# Patient Record
Sex: Male | Born: 1940 | Race: White | Hispanic: No | Marital: Married | Smoking: Never smoker
Health system: Southern US, Community
[De-identification: ages and names within clinical notes are randomized; demographics above are authoritative.]

## PROBLEM LIST (undated history)

## (undated) DIAGNOSIS — C61 Malignant neoplasm of prostate: Secondary | ICD-10-CM

## (undated) DIAGNOSIS — E785 Hyperlipidemia, unspecified: Secondary | ICD-10-CM

## (undated) DIAGNOSIS — C439 Malignant melanoma of skin, unspecified: Secondary | ICD-10-CM

## (undated) DIAGNOSIS — K219 Gastro-esophageal reflux disease without esophagitis: Secondary | ICD-10-CM

## (undated) DIAGNOSIS — F419 Anxiety disorder, unspecified: Secondary | ICD-10-CM

## (undated) DIAGNOSIS — I10 Essential (primary) hypertension: Secondary | ICD-10-CM

## (undated) HISTORY — DX: Anxiety disorder, unspecified: F41.9

## (undated) HISTORY — PX: HERNIA REPAIR: SHX51

## (undated) HISTORY — PX: MELANOMA EXCISION: SHX5266

## (undated) HISTORY — DX: Essential (primary) hypertension: I10

## (undated) HISTORY — DX: Hyperlipidemia, unspecified: E78.5

## (undated) HISTORY — DX: Gastro-esophageal reflux disease without esophagitis: K21.9

## (undated) HISTORY — DX: Malignant melanoma of skin, unspecified: C43.9

---

## 2008-05-24 ENCOUNTER — Ambulatory Visit: Payer: Self-pay | Admitting: Surgery

## 2008-05-27 ENCOUNTER — Other Ambulatory Visit: Payer: Self-pay

## 2008-05-27 ENCOUNTER — Ambulatory Visit: Payer: Self-pay | Admitting: Surgery

## 2008-11-29 ENCOUNTER — Ambulatory Visit: Payer: Self-pay | Admitting: Internal Medicine

## 2008-12-16 ENCOUNTER — Ambulatory Visit: Payer: Self-pay | Admitting: Internal Medicine

## 2008-12-24 ENCOUNTER — Ambulatory Visit: Payer: Self-pay | Admitting: Oncology

## 2008-12-27 ENCOUNTER — Ambulatory Visit: Payer: Self-pay | Admitting: Internal Medicine

## 2008-12-31 ENCOUNTER — Ambulatory Visit: Payer: Self-pay | Admitting: Oncology

## 2009-01-27 ENCOUNTER — Ambulatory Visit: Payer: Self-pay | Admitting: Otolaryngology

## 2009-01-27 ENCOUNTER — Ambulatory Visit: Payer: Self-pay | Admitting: Internal Medicine

## 2009-05-06 ENCOUNTER — Ambulatory Visit: Payer: Self-pay | Admitting: Otolaryngology

## 2009-05-09 ENCOUNTER — Ambulatory Visit: Payer: Self-pay | Admitting: Otolaryngology

## 2009-05-10 ENCOUNTER — Ambulatory Visit: Payer: Self-pay | Admitting: Otolaryngology

## 2009-05-12 ENCOUNTER — Inpatient Hospital Stay: Payer: Self-pay | Admitting: Otolaryngology

## 2009-05-27 ENCOUNTER — Ambulatory Visit: Payer: Self-pay | Admitting: Oncology

## 2009-05-29 ENCOUNTER — Ambulatory Visit: Payer: Self-pay | Admitting: Oncology

## 2009-06-03 ENCOUNTER — Ambulatory Visit: Payer: Self-pay | Admitting: Oncology

## 2009-06-18 ENCOUNTER — Emergency Department: Payer: Self-pay | Admitting: Unknown Physician Specialty

## 2009-06-29 ENCOUNTER — Ambulatory Visit: Payer: Self-pay | Admitting: Gastroenterology

## 2009-06-29 ENCOUNTER — Ambulatory Visit: Payer: Self-pay | Admitting: Oncology

## 2009-07-06 ENCOUNTER — Ambulatory Visit: Payer: Self-pay | Admitting: Unknown Physician Specialty

## 2009-07-29 ENCOUNTER — Ambulatory Visit: Payer: Self-pay | Admitting: Oncology

## 2009-08-05 ENCOUNTER — Ambulatory Visit: Payer: Self-pay | Admitting: Family Medicine

## 2009-08-29 ENCOUNTER — Ambulatory Visit: Payer: Self-pay | Admitting: Oncology

## 2009-09-27 ENCOUNTER — Ambulatory Visit: Payer: Self-pay | Admitting: Oncology

## 2009-09-28 ENCOUNTER — Ambulatory Visit: Payer: Self-pay | Admitting: Oncology

## 2009-10-20 ENCOUNTER — Ambulatory Visit: Payer: Self-pay | Admitting: Otolaryngology

## 2009-10-29 ENCOUNTER — Ambulatory Visit: Payer: Self-pay | Admitting: Oncology

## 2009-11-04 ENCOUNTER — Ambulatory Visit: Payer: Self-pay | Admitting: Oncology

## 2009-11-14 ENCOUNTER — Ambulatory Visit: Payer: Self-pay | Admitting: Radiation Oncology

## 2009-11-29 ENCOUNTER — Ambulatory Visit: Payer: Self-pay | Admitting: Oncology

## 2009-12-27 ENCOUNTER — Ambulatory Visit: Payer: Self-pay | Admitting: Oncology

## 2010-01-27 ENCOUNTER — Ambulatory Visit: Payer: Self-pay | Admitting: Oncology

## 2010-02-26 ENCOUNTER — Ambulatory Visit: Payer: Self-pay | Admitting: Oncology

## 2010-02-27 ENCOUNTER — Ambulatory Visit: Payer: Self-pay | Admitting: Oncology

## 2010-03-29 ENCOUNTER — Ambulatory Visit: Payer: Self-pay | Admitting: Oncology

## 2010-04-11 ENCOUNTER — Ambulatory Visit: Payer: Self-pay | Admitting: Oncology

## 2010-04-28 ENCOUNTER — Ambulatory Visit: Payer: Self-pay | Admitting: Oncology

## 2010-06-29 ENCOUNTER — Ambulatory Visit: Payer: Self-pay | Admitting: Oncology

## 2010-07-12 ENCOUNTER — Ambulatory Visit: Payer: Self-pay | Admitting: Oncology

## 2010-07-14 ENCOUNTER — Ambulatory Visit: Payer: Self-pay | Admitting: Oncology

## 2010-07-29 ENCOUNTER — Ambulatory Visit: Payer: Self-pay | Admitting: Oncology

## 2010-11-06 ENCOUNTER — Ambulatory Visit: Payer: Self-pay | Admitting: Oncology

## 2010-11-10 ENCOUNTER — Ambulatory Visit: Payer: Self-pay | Admitting: Oncology

## 2010-11-29 ENCOUNTER — Ambulatory Visit: Payer: Self-pay | Admitting: Oncology

## 2011-05-07 ENCOUNTER — Ambulatory Visit: Payer: Self-pay | Admitting: Oncology

## 2011-05-14 ENCOUNTER — Ambulatory Visit: Payer: Self-pay | Admitting: Oncology

## 2011-05-18 ENCOUNTER — Ambulatory Visit: Payer: Self-pay | Admitting: Oncology

## 2011-05-30 ENCOUNTER — Ambulatory Visit: Payer: Self-pay | Admitting: Oncology

## 2011-09-17 ENCOUNTER — Ambulatory Visit: Payer: Self-pay | Admitting: Oncology

## 2011-09-19 ENCOUNTER — Ambulatory Visit: Payer: Self-pay | Admitting: Oncology

## 2011-09-29 ENCOUNTER — Ambulatory Visit: Payer: Self-pay | Admitting: Oncology

## 2011-12-30 ENCOUNTER — Ambulatory Visit: Payer: Self-pay

## 2012-01-05 ENCOUNTER — Ambulatory Visit: Payer: Self-pay | Admitting: Internal Medicine

## 2012-01-15 ENCOUNTER — Ambulatory Visit: Payer: Self-pay | Admitting: Oncology

## 2012-01-23 ENCOUNTER — Ambulatory Visit: Payer: Self-pay | Admitting: Oncology

## 2012-01-23 LAB — CBC CANCER CENTER
Basophil %: 0.9 %
Eosinophil %: 2.7 %
HGB: 15 g/dL (ref 13.0–18.0)
Lymphocyte #: 1.6 x10 3/mm (ref 1.0–3.6)
Lymphocyte %: 33.3 %
MCH: 32.6 pg (ref 26.0–34.0)
Neutrophil %: 53.3 %
Platelet: 226 x10 3/mm (ref 150–440)
RBC: 4.61 10*6/uL (ref 4.40–5.90)
RDW: 13.4 % (ref 11.5–14.5)

## 2012-01-23 LAB — LACTATE DEHYDROGENASE: LDH: 201 U/L (ref 87–241)

## 2012-01-28 ENCOUNTER — Ambulatory Visit: Payer: Self-pay | Admitting: Oncology

## 2012-02-06 ENCOUNTER — Ambulatory Visit: Payer: Self-pay | Admitting: Oncology

## 2012-02-27 ENCOUNTER — Ambulatory Visit: Payer: Self-pay | Admitting: Oncology

## 2012-03-29 ENCOUNTER — Ambulatory Visit: Payer: Self-pay | Admitting: Oncology

## 2012-07-28 ENCOUNTER — Ambulatory Visit: Payer: Self-pay | Admitting: Oncology

## 2012-08-06 ENCOUNTER — Ambulatory Visit: Payer: Self-pay | Admitting: Oncology

## 2012-08-06 LAB — CBC CANCER CENTER
HCT: 42.8 % (ref 40.0–52.0)
Lymphocyte %: 26.1 %
MCHC: 35.2 g/dL (ref 32.0–36.0)
Monocyte %: 8.8 %
RBC: 4.51 10*6/uL (ref 4.40–5.90)
RDW: 14.3 % (ref 11.5–14.5)
WBC: 5.6 x10 3/mm (ref 3.8–10.6)

## 2012-08-06 LAB — BASIC METABOLIC PANEL
BUN: 18 mg/dL (ref 7–18)
Chloride: 105 mmol/L (ref 98–107)
Co2: 31 mmol/L (ref 21–32)
Creatinine: 1.05 mg/dL (ref 0.60–1.30)
EGFR (Non-African Amer.): >60
Glucose: 128 mg/dL — ABNORMAL HIGH (ref 65–99)
Osmolality: 289 (ref 275–301)
Potassium: 4.3 mmol/L (ref 3.5–5.1)
Sodium: 143 mmol/L (ref 136–145)

## 2012-08-06 LAB — LACTATE DEHYDROGENASE: LDH: 168 U/L (ref 85–241)

## 2012-08-07 LAB — PSA: PSA: 2.8 ng/mL (ref 0.0–4.0)

## 2012-08-29 ENCOUNTER — Ambulatory Visit: Payer: Self-pay | Admitting: Oncology

## 2013-01-26 ENCOUNTER — Emergency Department: Payer: Self-pay | Admitting: Emergency Medicine

## 2013-01-26 LAB — COMPREHENSIVE METABOLIC PANEL
Alkaline Phosphatase: 64 U/L (ref 50–136)
Anion Gap: 2 — ABNORMAL LOW (ref 7–16)
BUN: 22 mg/dL — ABNORMAL HIGH (ref 7–18)
Bilirubin,Total: 0.5 mg/dL (ref 0.2–1.0)
Calcium, Total: 8.7 mg/dL (ref 8.5–10.1)
Chloride: 106 mmol/L (ref 98–107)
Co2: 30 mmol/L (ref 21–32)
EGFR (Non-African Amer.): >60
Glucose: 117 mg/dL — ABNORMAL HIGH (ref 65–99)
Osmolality: 280 (ref 275–301)
SGOT(AST): 40 U/L — ABNORMAL HIGH (ref 15–37)
SGPT (ALT): 30 U/L (ref 12–78)
Sodium: 138 mmol/L (ref 136–145)

## 2013-01-26 LAB — APTT: Activated PTT: 26.8 secs (ref 23.6–35.9)

## 2013-01-26 LAB — CBC WITH DIFFERENTIAL/PLATELET
Basophil #: 0.1 10*3/uL (ref 0.0–0.1)
Basophil %: 1.2 %
Eosinophil #: 0.1 10*3/uL (ref 0.0–0.7)
HGB: 14.8 g/dL (ref 13.0–18.0)
Lymphocyte #: 2 10*3/uL (ref 1.0–3.6)
MCHC: 35.2 g/dL (ref 32.0–36.0)
MCV: 93 fL (ref 80–100)
Monocyte #: 0.5 x10 3/mm (ref 0.2–1.0)
Monocyte %: 7.9 %
Neutrophil #: 3.5 10*3/uL (ref 1.4–6.5)
Neutrophil %: 56.7 %
Platelet: 213 10*3/uL (ref 150–440)
RDW: 14.1 % (ref 11.5–14.5)

## 2013-01-26 LAB — TROPONIN I: Troponin-I: <0.02 ng/mL

## 2013-02-04 ENCOUNTER — Ambulatory Visit: Payer: Self-pay | Admitting: Oncology

## 2013-02-04 LAB — CBC CANCER CENTER
Basophil #: 0.1 x10 3/mm (ref 0.0–0.1)
Basophil %: 1.5 %
Eosinophil #: 0.1 x10 3/mm (ref 0.0–0.7)
Eosinophil %: 1.9 %
HCT: 43.1 % (ref 40.0–52.0)
HGB: 14.6 g/dL (ref 13.0–18.0)
Lymphocyte #: 2.1 x10 3/mm (ref 1.0–3.6)
Lymphocyte %: 37.4 %
MCH: 31.4 pg (ref 26.0–34.0)
MCHC: 34 g/dL (ref 32.0–36.0)
MCV: 92 fL (ref 80–100)
Monocyte #: 0.6 x10 3/mm (ref 0.2–1.0)
Monocyte %: 10.7 %
Neutrophil #: 2.7 x10 3/mm (ref 1.4–6.5)
Neutrophil %: 48.5 %
Platelet: 209 x10 3/mm (ref 150–440)
RBC: 4.66 10*6/uL (ref 4.40–5.90)
RDW: 13.8 % (ref 11.5–14.5)
WBC: 5.6 x10 3/mm (ref 3.8–10.6)

## 2013-02-04 LAB — LACTATE DEHYDROGENASE: LDH: 175 U/L (ref 85–241)

## 2013-02-26 ENCOUNTER — Ambulatory Visit: Payer: Self-pay | Admitting: Oncology

## 2013-08-06 ENCOUNTER — Ambulatory Visit: Payer: Self-pay | Admitting: Oncology

## 2013-08-12 ENCOUNTER — Ambulatory Visit: Payer: Self-pay | Admitting: Oncology

## 2013-08-12 LAB — CBC CANCER CENTER
Basophil #: 0.1 x10 3/mm (ref 0.0–0.1)
Basophil %: 1.3 %
Eosinophil #: 0.1 x10 3/mm (ref 0.0–0.7)
Eosinophil %: 1.7 %
HCT: 44.9 % (ref 40.0–52.0)
Lymphocyte #: 2.2 x10 3/mm (ref 1.0–3.6)
Lymphocyte %: 35.8 %
MCHC: 34.4 g/dL (ref 32.0–36.0)
Platelet: 180 x10 3/mm (ref 150–440)
RDW: 14 % (ref 11.5–14.5)

## 2013-08-12 LAB — LACTATE DEHYDROGENASE: LDH: 175 U/L (ref 85–241)

## 2013-08-29 ENCOUNTER — Ambulatory Visit: Payer: Self-pay | Admitting: Oncology

## 2013-12-22 LAB — CBC WITH DIFFERENTIAL/PLATELET
BASOS PCT: 0.4 %
Basophil #: 0.1 10*3/uL (ref 0.0–0.1)
Eosinophil #: 0 10*3/uL (ref 0.0–0.7)
Eosinophil %: 0.1 %
HCT: 49.9 % (ref 40.0–52.0)
HGB: 17.1 g/dL (ref 13.0–18.0)
LYMPHS ABS: 0.8 10*3/uL — AB (ref 1.0–3.6)
Lymphocyte %: 5.3 %
MCH: 32.3 pg (ref 26.0–34.0)
MCHC: 34.2 g/dL (ref 32.0–36.0)
MCV: 94 fL (ref 80–100)
MONO ABS: 0.6 x10 3/mm (ref 0.2–1.0)
Monocyte %: 3.9 %
NEUTROS ABS: 13.4 10*3/uL — AB (ref 1.4–6.5)
NEUTROS PCT: 90.3 %
PLATELETS: 213 10*3/uL (ref 150–440)
RBC: 5.29 10*6/uL (ref 4.40–5.90)
RDW: 14.1 % (ref 11.5–14.5)
WBC: 14.9 10*3/uL — ABNORMAL HIGH (ref 3.8–10.6)

## 2013-12-22 LAB — COMPREHENSIVE METABOLIC PANEL
ALBUMIN: 4.7 g/dL (ref 3.4–5.0)
ALK PHOS: 70 U/L
Anion Gap: 7 (ref 7–16)
BILIRUBIN TOTAL: 0.9 mg/dL (ref 0.2–1.0)
BUN: 31 mg/dL — AB (ref 7–18)
CALCIUM: 9.5 mg/dL (ref 8.5–10.1)
CO2: 23 mmol/L (ref 21–32)
Chloride: 104 mmol/L (ref 98–107)
Creatinine: 1.53 mg/dL — ABNORMAL HIGH (ref 0.60–1.30)
EGFR (African American): 51 — ABNORMAL LOW
EGFR (Non-African Amer.): 44 — ABNORMAL LOW
Glucose: 200 mg/dL — ABNORMAL HIGH (ref 65–99)
Osmolality: 280 (ref 275–301)
Potassium: 4.5 mmol/L (ref 3.5–5.1)
SGOT(AST): 38 U/L — ABNORMAL HIGH (ref 15–37)
SGPT (ALT): 45 U/L (ref 12–78)
SODIUM: 134 mmol/L — AB (ref 136–145)
Total Protein: 9 g/dL — ABNORMAL HIGH (ref 6.4–8.2)

## 2013-12-22 LAB — MAGNESIUM: MAGNESIUM: 1.8 mg/dL

## 2013-12-22 LAB — LIPASE, BLOOD: Lipase: 85 U/L (ref 73–393)

## 2013-12-22 LAB — TROPONIN I

## 2013-12-23 ENCOUNTER — Inpatient Hospital Stay: Payer: Self-pay | Admitting: Internal Medicine

## 2013-12-23 LAB — URINALYSIS, COMPLETE
BACTERIA: NONE SEEN
Bilirubin,UR: NEGATIVE
Blood: NEGATIVE
GLUCOSE, UR: NEGATIVE mg/dL (ref 0–75)
Hyaline Cast: 25
Leukocyte Esterase: NEGATIVE
NITRITE: NEGATIVE
PH: 5 (ref 4.5–8.0)
Protein: 30
RBC,UR: 1 /HPF (ref 0–5)
Specific Gravity: 1.027 (ref 1.003–1.030)
Squamous Epithelial: <1
WBC UR: 2 /HPF (ref 0–5)

## 2013-12-23 LAB — OCCULT BLOOD X 1 CARD TO LAB, STOOL: Occult Blood, Feces: NEGATIVE

## 2013-12-23 LAB — TROPONIN I
Troponin-I: <0.02 ng/mL
Troponin-I: <0.02 ng/mL

## 2013-12-23 LAB — CK TOTAL AND CKMB (NOT AT ARMC)
CK, TOTAL: 156 U/L
CK, Total: 80 U/L
CK, Total: 109 U/L
CK-MB: 2.3 ng/mL (ref 0.5–3.6)
CK-MB: 2.8 ng/mL (ref 0.5–3.6)
CK-MB: 2.7 ng/mL (ref 0.5–3.6)

## 2013-12-23 LAB — CLOSTRIDIUM DIFFICILE(ARMC)

## 2013-12-24 LAB — CBC WITH DIFFERENTIAL/PLATELET
Basophil #: 0 10*3/uL (ref 0.0–0.1)
Basophil %: 1 %
Eosinophil #: 0.1 10*3/uL (ref 0.0–0.7)
Eosinophil %: 2.4 %
HCT: 35.9 % — ABNORMAL LOW (ref 40.0–52.0)
HGB: 12.7 g/dL — ABNORMAL LOW (ref 13.0–18.0)
LYMPHS PCT: 21.1 %
Lymphocyte #: 0.8 10*3/uL — ABNORMAL LOW (ref 1.0–3.6)
MCH: 33.3 pg (ref 26.0–34.0)
MCHC: 35.3 g/dL (ref 32.0–36.0)
MCV: 94 fL (ref 80–100)
MONO ABS: 0.6 x10 3/mm (ref 0.2–1.0)
Monocyte %: 16 %
NEUTROS ABS: 2.2 10*3/uL (ref 1.4–6.5)
NEUTROS PCT: 59.5 %
Platelet: 129 10*3/uL — ABNORMAL LOW (ref 150–440)
RBC: 3.81 10*6/uL — AB (ref 4.40–5.90)
RDW: 14 % (ref 11.5–14.5)
WBC: 3.7 10*3/uL — ABNORMAL LOW (ref 3.8–10.6)

## 2013-12-24 LAB — BASIC METABOLIC PANEL
ANION GAP: 6 — AB (ref 7–16)
BUN: 18 mg/dL (ref 7–18)
CHLORIDE: 110 mmol/L — AB (ref 98–107)
CO2: 24 mmol/L (ref 21–32)
Calcium, Total: 7.7 mg/dL — ABNORMAL LOW (ref 8.5–10.1)
Creatinine: 1.24 mg/dL (ref 0.60–1.30)
EGFR (African American): >60
EGFR (Non-African Amer.): 57 — ABNORMAL LOW
Glucose: 98 mg/dL (ref 65–99)
Osmolality: 281 (ref 275–301)
POTASSIUM: 3.9 mmol/L (ref 3.5–5.1)
SODIUM: 140 mmol/L (ref 136–145)

## 2013-12-24 LAB — HEMOGLOBIN A1C: HEMOGLOBIN A1C: 6 % (ref 4.2–6.3)

## 2013-12-25 LAB — BASIC METABOLIC PANEL
Anion Gap: 6 — ABNORMAL LOW (ref 7–16)
BUN: 13 mg/dL (ref 7–18)
CALCIUM: 7.5 mg/dL — AB (ref 8.5–10.1)
CHLORIDE: 114 mmol/L — AB (ref 98–107)
CO2: 22 mmol/L (ref 21–32)
Creatinine: 1.17 mg/dL (ref 0.60–1.30)
Glucose: 90 mg/dL (ref 65–99)
OSMOLALITY: 283 (ref 275–301)
POTASSIUM: 3.8 mmol/L (ref 3.5–5.1)
Sodium: 142 mmol/L (ref 136–145)

## 2013-12-25 LAB — WBCS, STOOL

## 2013-12-26 LAB — STOOL CULTURE

## 2013-12-28 LAB — CULTURE, BLOOD (SINGLE)

## 2014-02-03 ENCOUNTER — Ambulatory Visit: Payer: Self-pay | Admitting: Oncology

## 2014-02-03 LAB — CBC CANCER CENTER
Basophil #: 0.1 x10 3/mm (ref 0.0–0.1)
Basophil %: 1.1 %
Eosinophil #: 0.1 x10 3/mm (ref 0.0–0.7)
Eosinophil %: 2.5 %
HCT: 40.8 % (ref 40.0–52.0)
HGB: 14 g/dL (ref 13.0–18.0)
LYMPHS ABS: 1.7 x10 3/mm (ref 1.0–3.6)
Lymphocyte %: 31.4 %
MCH: 32 pg (ref 26.0–34.0)
MCHC: 34.5 g/dL (ref 32.0–36.0)
MCV: 93 fL (ref 80–100)
MONOS PCT: 10.4 %
Monocyte #: 0.6 x10 3/mm (ref 0.2–1.0)
NEUTROS ABS: 3 x10 3/mm (ref 1.4–6.5)
Neutrophil %: 54.6 %
Platelet: 190 x10 3/mm (ref 150–440)
RBC: 4.39 10*6/uL — ABNORMAL LOW (ref 4.40–5.90)
RDW: 14.5 % (ref 11.5–14.5)
WBC: 5.6 x10 3/mm (ref 3.8–10.6)

## 2014-02-03 LAB — LACTATE DEHYDROGENASE: LDH: 184 U/L (ref 85–241)

## 2014-02-26 ENCOUNTER — Ambulatory Visit: Payer: Self-pay | Admitting: Oncology

## 2014-07-09 DIAGNOSIS — C439 Malignant melanoma of skin, unspecified: Secondary | ICD-10-CM

## 2014-07-09 DIAGNOSIS — F419 Anxiety disorder, unspecified: Secondary | ICD-10-CM | POA: Insufficient documentation

## 2014-07-09 DIAGNOSIS — E7800 Pure hypercholesterolemia, unspecified: Secondary | ICD-10-CM | POA: Insufficient documentation

## 2014-07-09 DIAGNOSIS — K219 Gastro-esophageal reflux disease without esophagitis: Secondary | ICD-10-CM

## 2014-07-09 DIAGNOSIS — I10 Essential (primary) hypertension: Secondary | ICD-10-CM

## 2014-07-09 DIAGNOSIS — E785 Hyperlipidemia, unspecified: Secondary | ICD-10-CM | POA: Insufficient documentation

## 2014-07-09 HISTORY — DX: Malignant melanoma of skin, unspecified: C43.9

## 2014-07-09 HISTORY — DX: Anxiety disorder, unspecified: F41.9

## 2014-07-09 HISTORY — DX: Gastro-esophageal reflux disease without esophagitis: K21.9

## 2014-07-09 HISTORY — DX: Essential (primary) hypertension: I10

## 2014-07-09 HISTORY — DX: Hyperlipidemia, unspecified: E78.5

## 2014-12-31 ENCOUNTER — Ambulatory Visit
Admit: 2014-12-31 | Disposition: A | Discharge status: Home / Self Care | Payer: Self-pay | Attending: Oncology | Admitting: Oncology

## 2015-01-28 ENCOUNTER — Ambulatory Visit
Admit: 2015-01-28 | Disposition: A | Discharge status: Home / Self Care | Payer: Self-pay | Attending: Oncology | Admitting: Oncology

## 2015-02-19 NOTE — Discharge Summary (Signed)
PATIENT NAMEAGUSTIN, Christian MR#:  [id number] DATE OF BIRTH:  1941/05/17  DATE OF ADMISSION:  12/23/2013 DATE OF DISCHARGE:  12/25/2013  PRIMARY CARE PHYSICIAN:  Dr. Ilene Qua  FINAL DIAGNOSES: 1.  Acute gastroenteritis.  2.  Hypotension.  3.  Acute renal failure, which improved.  4.  Hyperlipidemia.  5.  Gastroesophageal reflux disease.  6.  Impaired fasting glucose.   MEDICATIONS ON DISCHARGE: Include lisinopril 20 mg daily, omeprazole 20 mg daily, simvastatin 20 mg at bedtime, metronidazole 250 mg every 8 hours for 5 more days, Zofran 4 mg every 4 hours as needed for nausea, Cipro 500 mg 1 tablet every 12 hours for 5 more days.   DIET: Low sodium diet, regular consistency.   ACTIVITY: As tolerated.  FOLLOWUP: With Dr. Ilene Qua as outpatient, 1 to 2 weeks.   HOSPITAL COURSE: The patient was admitted 12/23/2013, discharged 12/25/2013, came in with nausea, vomiting, diarrhea. Was admitted with acute gastroenteritis, family members had similar symptoms. The patient was given IV fluid hydration and started on empiric antibiotics. The patient had low blood pressure and blood pressure medications were held and acute renal failure.  LABORATORY AND RADIOLOGICAL DATA DURING THE HOSPITAL COURSE: Included a troponin that was negative, magnesium of 1.8, lipase 85, glucose 200, BUN 31, creatinine 1.53. Sodium 134, potassium 4.5, chloride 104, CO2 of 23, calcium 9.5. Liver function tests: AST 38, ALT 45, alkaline phosphatase 70, total protein 9.0. White blood cell count 14.9, H and H 17.1 and 49.9, platelet count of 213, lactic acid 3.1.  EKG normal sinus rhythm, left atrial enlargement, left ventricular hypertrophy. Urinalysis: Trace ketones.  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  Blood cultures negative. Clostridium  difficile antigen negative. Troponins negative x 2. Occult blood negative. White blood cells stool 80% polymorphonuclear cells, 10% eosinophils, 10% mononuclear cells. No red blood cells seen. Stool culture no Salmonella or Shigella. Ova and parasites are still pending. Hemoglobin A1c 6.0. Creatinine upon discharge 1.17. Last white blood cell count 3.7.  HOSPITAL COURSE PER PROBLEM LIST:  1.  For the gastroenteritis, since family members have this also, could be a viral issue but could also be a bacterial issue. So far the stool studies are negative. Ova and parasites still pending. The patient empirically started on Cipro and Flagyl and will complete a week's course. The patient had improved. Still had some diarrhea but was able to tolerate diet. No abdominal pain and was tolerating the food.  2.  Hypotension. This had improved. Blood pressure medications were held during the hospital course. Lisinopril can be restarted as outpatient.  3.  Acute renal failure. This had improved during the hospital course.  4.  Hyperlipidemia: The patient is on his Zocor.  5.  Gastroesophageal reflux disease. The patient is on omeprazole as outpatient.  6.  Impaired fasting glucose. Hemoglobin A1c 6.0.  Diet:  Exercise and weight loss is important.  7.  Abdominal aortic aneurysm.  Follow up as outpatient with Dr. Ilene Qua.  TIME SPENT ON DISCHARGE:  35 minutes.   ____________________________ Tana Conch. Leslye Peer, MD rjw:ce D: 12/25/2013 17:34:58 ET T: 12/25/2013 18:00:33 ET JOB#: [id number]  cc: Tana Conch. Leslye Peer, MD, <Dictator> Fonnie Jarvis. Ilene Qua, MD Marisue Brooklyn MD ELECTRONICALLY SIGNED 12/29/2013 10:06

## 2015-02-19 NOTE — H&P (Signed)
PATIENT NAMEKASIR, Raymond Christian MR#:  [id number] DATE OF BIRTH:  11/08/1940  DATE OF ADMISSION:  12/23/2013  PRIMARY CARE PHYSICIAN:  Dr. Domenick Gong.   REFERRING PHYSICIAN:  Dr. Marjean Donna.   CHIEF COMPLAINT:  Nausea, vomiting and diarrhea.   HISTORY OF PRESENT ILLNESS:  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  The patient's wife had similar symptoms, lasted for two days.  The patient states has watery diarrhea.  Vomiting clear liquid.  Work-up in the Emergency Department with a CT abdomen and pelvis showed fluid within the colon likely representing the diarrhea state.  There was a 7 mm gallstone in the region of the gallbladder neck.  Otherwise, no CT evidence of acute cholecystitis.  The patient denies having any fever.  Denies having any abdominal pain.  The patient states that experienced severe generalized weakness.  The patient is found to have elevated white blood cell count of 15,000, elevated lactic acid of 3.1.  The patient received IV fluids.   PAST MEDICAL HISTORY: 1.  Hypertension.  2.  Metastatic melanoma of the neck.  3.  Melanoma of the head. 4.  Bilateral hernia surgery.  ALLERGIES:  No known drug allergies.   HOME MEDICATIONS: 1.  Simvastatin 20 mg once a day.  2.  Omeprazole 20 mg once a day.  3.  Lisinopril 20 mg once a day.   SOCIAL HISTORY:  No history of smoking.  Drank alcohol heavily in the past, quit 20 years back.  Denies using any illicit drugs.  Married, lives with his wife, retired.   FAMILY HISTORY:  Mother, 37 year old, still living, has Alzheimer's dementia.  Father died of heart problems.   REVIEW OF SYSTEMS: CONSTITUTIONAL:  Severe generalized weakness.  EYES:  No change in vision.  EARS, NOSE,  THROAT:  No change in hearing.  RESPIRATORY:  No cough, shortness of breath.  CARDIOVASCULAR:  No chest pains or palpitations.  GASTROINTESTINAL:  Has nausea, vomiting and diarrhea.  GENITOURINARY:  No dysuria or hematuria.  ENDOCRINE:  No polyuria or polydipsia. HEMATOLOGIC:  No easy bruising or bleeding.  SKIN:  No rash or lesions.  MUSCULOSKELETAL:  No joint pains and aches.  NEUROLOGIC:  No weakness or numbness in any part of the body.   PHYSICAL EXAMINATION: GENERAL:  This is a well-built, well-nourished, age-appropriate male, lying down in the bed, not in distress.  VITAL SIGNS:  Temperature 96.9, pulse 96, blood pressure 98/64, respiratory rate of 16, oxygen saturation is 95% on room air.  HEENT:  Head normocephalic, atraumatic.  Eyes, no scleral icterus.  Conjunctivae normal.  Pupils equal and react to light.  Mucous membranes, mild dryness.  No pharyngeal erythema.  NECK:  Supple.  No lymphadenopathy.  No JVD.  No carotid bruit.  No thyromegaly.  CHEST:  Has no focal tenderness.  LUNGS:  Bilateral clear to auscultation.  HEART:  S1, S2 regular.  No murmurs are heard.  ABDOMEN:  Bowel sounds plus, soft, nontender, nondistended.  Could not appreciate any hepatosplenomegaly.  EXTREMITIES:  No pedal edema.  Pulses 2+.  NEUROLOGIC:  The patient is alert, oriented to place, person and time.  Cranial nerves II through XII intact.  Motor 5 by 5 in upper and lower extremities.  SKIN:  No rash or lesions.  MUSCULOSKELETAL:  Good range of motion in all the extremities.   LABORATORY DATA:  UA negative for nitrites and leukocyte esterase.  CT abdomen and pelvis, as mentioned above, fluid within the colon consistent with diarrhea state, a 7 mm gallstone in the region of the gallbladder neck.  No CT evidence of acute cholecystitis.   distal abdominal aortic right common iliac artery aneurysm, cardiomegaly with a large hiatal hernia.   Lactic acid of 3.1.  CMP:  BUN 31, creatinine of 1.53.   Glucose of 200.  Rest of all the values are within normal limits.  CBC:  WBC of 15,000 with a left shift of 90% neutrophils.   ASSESSMENT AND PLAN:  Mr. Raymond Christian is a 74 year old male who comes to the Emergency Department with nausea, vomiting and diarrhea.  1.  Acute gastroenteritis.  The patient's wife had similar symptoms.  Continue with IV fluids.  Continue with supportive management.  We will also check the cardiac enzymes.  The patient denies having any chest pain, palpitations.  Clostridium difficile is negative. 2.  Mild renal insufficiency secondary to dehydration.  Continue with IV fluids.  3.  Hypertension.  We will hold the blood pressure medications, concerning about low-normal blood pressure.  4.  Keep the patient on deep vein thrombosis prophylaxis with Lovenox.  TIME SPENT:  50 minutes.    ____________________________ Monica Becton, MD pv:ea D: 12/23/2013 02:07:48 ET T: 12/23/2013 02:57:47 ET JOB#: [id number]  cc: Monica Becton, MD, <Dictator> Fonnie Jarvis. Ilene Qua, MD Monica Becton MD ELECTRONICALLY SIGNED 01/03/2014 22:39

## 2015-06-30 DIAGNOSIS — C61 Malignant neoplasm of prostate: Secondary | ICD-10-CM

## 2015-06-30 HISTORY — DX: Malignant neoplasm of prostate: C61

## 2015-07-29 ENCOUNTER — Encounter: Payer: Self-pay | Admitting: *Deleted

## 2015-08-01 ENCOUNTER — Ambulatory Visit (INDEPENDENT_AMBULATORY_CARE_PROVIDER_SITE_OTHER): Payer: Commercial Managed Care - PPO | Admitting: Obstetrics and Gynecology

## 2015-08-01 ENCOUNTER — Encounter: Payer: Self-pay | Admitting: Obstetrics and Gynecology

## 2015-08-01 VITALS — BP 137/83 | HR 79 | Resp 16 | Ht 68.0 in | Wt 176.8 lb

## 2015-08-01 DIAGNOSIS — R972 Elevated prostate specific antigen [PSA]: Secondary | ICD-10-CM | POA: Diagnosis not present

## 2015-08-01 DIAGNOSIS — R3989 Other symptoms and signs involving the genitourinary system: Secondary | ICD-10-CM | POA: Diagnosis not present

## 2015-08-01 LAB — URINALYSIS, COMPLETE
Bilirubin, UA: NEGATIVE
Glucose, UA: NEGATIVE
Ketones, UA: NEGATIVE
Leukocytes, UA: NEGATIVE
Nitrite, UA: NEGATIVE
PH UA: 5 (ref 5.0–7.5)
Protein, UA: NEGATIVE
RBC, UA: NEGATIVE
Specific Gravity, UA: 1.02 (ref 1.005–1.030)
Urobilinogen, Ur: 0.2 mg/dL (ref 0.2–1.0)

## 2015-08-01 LAB — BLADDER SCAN AMB NON-IMAGING

## 2015-08-01 LAB — MICROSCOPIC EXAMINATION
Bacteria, UA: NONE SEEN
Epithelial Cells (non renal): NONE SEEN /hpf (ref 0–10)
RBC MICROSCOPIC, UA: NONE SEEN /HPF (ref 0–?)
WBC UA: NONE SEEN /HPF (ref 0–?)

## 2015-08-01 NOTE — Progress Notes (Signed)
08/01/2015 12:58 PM   Raymond Christian June 18, 1941 [id number]  Referring provider: No referring provider defined for this encounter.  Chief Complaint  Patient presents with  . Elevated PSA  . Establish Care    HPI: Patient is a healthy 74 year old male presenting today as a referral from his primary care provider for elevated PSA of 5.14 on 07/22/15. Last available PSA 4.4 in 2013. He currently denies any significant urinary symptoms including gross hematuria, dysuria, urgency, frequency or sensation of incomplete bladder emptying.  No family history of prostate cancer.  07/22/15 5.14 2013 PSA 4.4 2012 PSA 2.8  PMH: Past Medical History  Diagnosis Date  . Essential (primary) hypertension 07/09/2014  . Gastro-esophageal reflux disease without esophagitis 07/09/2014  . Anxiety 07/09/2014  . HLD (hyperlipidemia) 07/09/2014  . Malignant melanoma ([place]) 07/09/2014    Surgical History: Past Surgical History  Procedure Laterality Date  . Hernia repair      x2  . Melanoma excision      Home Medications:    Medication List       This list is accurate as of: 08/01/15 12:58 PM.  Always use your most recent med list.               lisinopril-hydrochlorothiazide 20-12.5 MG tablet  Commonly known as:  PRINZIDE,ZESTORETIC  Take by mouth.     omeprazole 20 MG capsule  Commonly known as:  PRILOSEC  Take by mouth.     simvastatin 20 MG tablet  Commonly known as:  ZOCOR  Take by mouth.        Allergies: No Known Allergies  Family History: Family History  Problem Relation Age of Onset  . Diabetes Sister     Social History:  reports that he has never smoked. He does not have any smokeless tobacco history on file. He reports that he drinks alcohol. He reports that he does not use illicit drugs.  ROS: UROLOGY Frequent Urination?: No Hard to postpone urination?: No Burning/pain with urination?: No Get up at night to urinate?: Yes Leakage of urine?: No Urine stream  starts and stops?: No Trouble starting stream?: No Do you have to strain to urinate?: No Blood in urine?: No Urinary tract infection?: No Sexually transmitted disease?: No Injury to kidneys or bladder?: No Painful intercourse?: No Weak stream?: No Erection problems?: Yes Penile pain?: No  Gastrointestinal Nausea?: No Vomiting?: No Indigestion/heartburn?: Yes Diarrhea?: No Constipation?: No  Constitutional Fever: No Night sweats?: No Weight loss?: No Fatigue?: No  Skin Skin rash/lesions?: No Itching?: No  Eyes Blurred vision?: No Double vision?: No  Ears/Nose/Throat Sore throat?: Yes Sinus problems?: No  Hematologic/Lymphatic Swollen glands?: No Easy bruising?: Yes  Cardiovascular Leg swelling?: No Chest pain?: No  Respiratory Cough?: No Shortness of breath?: No  Endocrine Excessive thirst?: No  Musculoskeletal Back pain?: No Joint pain?: No  Neurological Headaches?: No Dizziness?: No  Psychologic Depression?: No Anxiety?: No  Physical Exam: BP 137/83 mmHg  Pulse 79  Resp 16  Ht 5\' 8"  (1.727 m)  Wt 176 lb 12.8 oz (80.196 kg)  BMI 26.89 kg/m2  Constitutional:  Alert and oriented, No acute distress. HEENT: [place] AT, moist mucus membranes.  Trachea midline, no masses. Cardiovascular: No clubbing, cyanosis, or edema. Respiratory: Normal respiratory effort, no increased work of breathing. GI: Abdomen is soft, nontender, nondistended, no abdominal masses GU: No CVA tenderness.  DRE: small firm prostate, no nodules, uncircumcised phallus easily retractable foreskin, testicles descended bilaterally without palpable masses or tenderness Skin: No  rashes, bruises or suspicious lesions. Lymph: No cervical or inguinal adenopathy. Neurologic: Grossly intact, no focal deficits, moving all 4 extremities. Psychiatric: Normal mood and affect.  Laboratory Data: Results for orders placed or performed in visit on 08/01/15  BLADDER SCAN AMB NON-IMAGING   Result Value Ref Range   Scan Result 50ml    Urinalysis  Pertinent Imaging:   Assessment & Plan:    1. Elevated PSA- DRE suspicious for a small firm prostate on exam. Will recheck PSA today.  Most likely will recommend prostate biopsy given patient's good health and suspicious DRE.  We reviewed the implications of an elevated PSA and the uncertainty surrounding it. In general, a man's PSA increases with age and is produced by both normal and cancerous prostate tissue. Differential for elevated PSA is BPH, prostate cancer, infection, recent intercourse/ejaculation, prostate infarction, recent urethroscopic manipulation (foley placement/cystoscopy) and prostatitis. Management of an elevated PSA can include observation or prostate biopsy and we discussed this in detail. We discussed that indications for prostate biopsy are defined by age and race specific PSA cutoffs as well as a PSA velocity of 0.75/year. - Urinalysis, Complete - BLADDER SCAN AMB NON-IMAGING   Return in about 3 months (around 11/01/2015).  These notes generated with voice recognition software. I apologize for typographical errors.  Herbert Moors, [facility] [address] [place], [place] [address] [phone]

## 2015-08-02 ENCOUNTER — Telehealth: Payer: Self-pay | Admitting: Obstetrics and Gynecology

## 2015-08-02 LAB — PSA, TOTAL AND FREE
PSA FREE PCT: 12.1 %
PSA FREE: 0.52 ng/mL
Prostate Specific Ag, Serum: 4.3 ng/mL — ABNORMAL HIGH (ref 0.0–4.0)

## 2015-08-02 NOTE — Telephone Encounter (Signed)
-----   Message from Roda Shutters, [place] sent at 08/02/2015 10:57 AM EDT ----- Please notify patient that his PSA was 4.3 which is slightly lower than his previous however given his abnormal prostate exam at his last visit I recommend that he have a prostate biopsy. Please either mail patient the pre-and post biopsy instructions or have him come by the office and pick them up prior to biopsy. Please schedule appointment for prostate biopsy if patient is agreeable. Patient is not agreeable with prostate biopsy I recommend that he at least is to have a 4K score drawn. Thanks

## 2015-08-02 NOTE — Telephone Encounter (Signed)
Patient notified and is agreeable to a prostate biopsy. The biopsy instructions were given over the phone and he verbalized understanding a copy was also mailed to him, he was transferred to the front to have biopsy apt made/SW

## 2015-08-17 ENCOUNTER — Encounter: Payer: Self-pay | Admitting: Urology

## 2015-08-17 ENCOUNTER — Ambulatory Visit (INDEPENDENT_AMBULATORY_CARE_PROVIDER_SITE_OTHER): Payer: Commercial Managed Care - PPO | Admitting: Urology

## 2015-08-17 ENCOUNTER — Other Ambulatory Visit: Payer: Self-pay | Admitting: Urology

## 2015-08-17 VITALS — BP 158/76 | HR 80 | Ht 68.0 in | Wt 177.6 lb

## 2015-08-17 DIAGNOSIS — R972 Elevated prostate specific antigen [PSA]: Secondary | ICD-10-CM

## 2015-08-17 MED ORDER — GENTAMICIN SULFATE 40 MG/ML IJ SOLN
80.0000 mg | Freq: Once | INTRAMUSCULAR | Status: AC
  Administered 2015-08-17: 80 mg via INTRAMUSCULAR

## 2015-08-17 MED ORDER — LEVOFLOXACIN 500 MG PO TABS
500.0000 mg | ORAL_TABLET | Freq: Once | ORAL | Status: AC
  Administered 2015-08-17: 500 mg via ORAL

## 2015-08-17 NOTE — Progress Notes (Signed)
Prostate Biopsy Procedure   Informed consent was obtained after discussing risks/benefits of the procedure.  A time out was performed to ensure correct patient identity.  Pre-Procedure: - Last PSA Level: 4.4  - Gentamicin given prophylactically - Levaquin 500 mg administered PO -Transrectal Ultrasound performed revealing a 29  gm prostate -No significant hypoechoic or median lobe noted  Procedure: - Prostate block performed using 10 cc 1% lidocaine and biopsies taken from sextant areas, a total of 12 under ultrasound guidance.  Post-Procedure: - Patient tolerated the procedure well - He was counseled to seek immediate medical attention if experiences any severe pain, significant bleeding, or fevers - Return in one week to discuss biopsy results

## 2015-08-23 LAB — PATHOLOGY REPORT

## 2015-08-25 ENCOUNTER — Other Ambulatory Visit: Payer: Self-pay | Admitting: Urology

## 2015-08-25 ENCOUNTER — Ambulatory Visit (INDEPENDENT_AMBULATORY_CARE_PROVIDER_SITE_OTHER): Payer: Commercial Managed Care - PPO | Admitting: Urology

## 2015-08-25 ENCOUNTER — Telehealth: Payer: Self-pay

## 2015-08-25 VITALS — BP 152/92 | HR 71 | Ht 68.0 in | Wt 177.0 lb

## 2015-08-25 DIAGNOSIS — C61 Malignant neoplasm of prostate: Secondary | ICD-10-CM | POA: Diagnosis not present

## 2015-08-25 NOTE — Telephone Encounter (Signed)
  Oncology Nurse Navigator Documentation  Referral date to RadOnc/MedOnc: 08/25/15 (08/25/15 1400) Navigator Encounter Type: Introductory phone call (08/25/15 1400)         Interventions: Coordination of Care (08/25/15 1400)   Coordination of Care: MD Appointments (08/25/15 1400)        Time Spent with Patient: 15 (08/25/15 1400)   Spoke with Raymond Christian on the phone. Appt made for consult with Dr Baruch Gouty on 08/31/15 1330. Readback performed. States no questions or needs at present.

## 2015-08-25 NOTE — Progress Notes (Signed)
08/25/2015 11:54 AM   [name] Mar 21, 1941 [id number]  Referring provider: Sherrin Daisy, MD [place], [place] [address]  Chief Complaint  Patient presents with  . Prostate Cancer    biopsy results  . Results    HPI: The patient is a 74 year old gentleman who presents for his prostate biopsy results. His pre-biopsy PSA was 4.3. She shows one core 3+4 = 7 in 5% the core. He has 5 further cores of Gleason 3+3 = 6. The percentages are 2%, 5%, 5%, 10%, 25% and 54%. Her parents to further discuss these results.   PMH: Past Medical History  Diagnosis Date  . Essential (primary) hypertension 07/09/2014  . Gastro-esophageal reflux disease without esophagitis 07/09/2014  . Anxiety 07/09/2014  . HLD (hyperlipidemia) 07/09/2014  . Malignant melanoma ([place]) 07/09/2014    Surgical History: Past Surgical History  Procedure Laterality Date  . Hernia repair      x2  . Melanoma excision      Home Medications:    Medication List       This list is accurate as of: 08/25/15 11:54 AM.  Always use your most recent med list.               lisinopril-hydrochlorothiazide 20-12.5 MG tablet  Commonly known as:  PRINZIDE,ZESTORETIC  Take by mouth.     omeprazole 20 MG capsule  Commonly known as:  PRILOSEC  Take by mouth.     simvastatin 20 MG tablet  Commonly known as:  ZOCOR  Take by mouth.        Allergies: No Known Allergies  Family History: Family History  Problem Relation Age of Onset  . Diabetes Sister     Social History:  reports that he has never smoked. He does not have any smokeless tobacco history on file. He reports that he drinks alcohol. He reports that he does not use illicit drugs.  ROS:                                        Physical Exam: BP 152/92 mmHg  Pulse 71  Ht 5\' 8"  (1.727 m)  Wt 177 lb (80.287 kg)  BMI 26.92 kg/m2  Constitutional:  Alert and oriented, No acute distress. HEENT: [place] AT, moist  mucus membranes.  Trachea midline, no masses. Cardiovascular: No clubbing, cyanosis, or edema. Respiratory: Normal respiratory effort, no increased work of breathing. GI: Abdomen is soft, nontender, nondistended, no abdominal masses GU: No CVA tenderness.  Skin: No rashes, bruises or suspicious lesions. Lymph: No cervical or inguinal adenopathy. Neurologic: Grossly intact, no focal deficits, moving all 4 extremities. Psychiatric: Normal mood and affect.  Laboratory Data: Lab Results  Component Value Date   WBC 5.6 02/03/2014   HGB 14.0 02/03/2014   HCT 40.8 02/03/2014   MCV 93 02/03/2014   PLT 190 02/03/2014    Lab Results  Component Value Date   CREATININE 1.17 12/25/2013    Lab Results  Component Value Date   PSA 4.3* 08/01/2015   PSA 2.8 08/06/2012   PSA 4.4* 01/23/2012    No results found for: TESTOSTERONE  Lab Results  Component Value Date   HGBA1C 6.0 12/24/2013    Urinalysis    Component Value Date/Time   COLORURINE Amber 12/22/2013 2307   APPEARANCEUR Hazy 12/22/2013 2307   LABSPEC 1.027 12/22/2013 2307   PHURINE 5.0 12/22/2013 2307  GLUCOSEU Negative 08/01/2015 0959   GLUCOSEU Negative 12/22/2013 2307   HGBUR Negative 12/22/2013 2307   BILIRUBINUR Negative 08/01/2015 0959   BILIRUBINUR Negative 12/22/2013 2307   KETONESUR Trace 12/22/2013 2307   PROTEINUR 30 mg/dL 12/22/2013 2307   NITRITE Negative 08/01/2015 0959   NITRITE Negative 12/22/2013 2307   LEUKOCYTESUR Negative 08/01/2015 0959   LEUKOCYTESUR Negative 12/22/2013 2307    Assessment & Plan:    I had a long discussion with the patient regarding the natural history of prostate cancer. I discussed with him that he is of 74 years of age which which is past the age that many would recommend treatment of his prostate cancer, but this is controversial. I discussed his treatment options which include watchful waiting, active surveillance, radical prostatectomy, and radiation. He is against active  surveillance as he does not want repeat prostate biopsies. He is also against surgical treatment via radical prostatectomy. I agree that these are both reasonable decisions by him. He is in between watchful waiting versus radiation therapy. I discussed with him again that there is no standard answer for what his best course of action would be. He knows that he is of intermediate risk disease due to his one core 5% 3+4 = 7 prostate cancer. As such, I have set him up for referral to radiation oncology to discuss radiation treatment options. He is agreeable to this at this time however he is leaning towards watchful waiting. If he decides not to undergo radiation, we will see him back in the office in 6 months to repeat his PSA to ensure there are no rapid changes.   1.  Prostate cancer [facility]) - as above - Ambulatory referral to Radiation Oncology - PSA in 6 months   Return in about 6 months (around 02/23/2016) for with repeat PSA prior.  Nickie Retort, MD  [facility] [address], [place], [place] [address] [phone]

## 2015-08-31 ENCOUNTER — Ambulatory Visit
Admission: RE | Admit: 2015-08-31 | Discharge: 2015-08-31 | Disposition: A | Discharge status: Home / Self Care | Payer: Commercial Managed Care - PPO | Source: Ambulatory Visit | Attending: Radiation Oncology | Admitting: Radiation Oncology

## 2015-08-31 ENCOUNTER — Encounter: Payer: Self-pay | Admitting: Radiation Oncology

## 2015-08-31 VITALS — BP 169/89 | HR 72 | Temp 96.5°F | Resp 20 | Wt 177.9 lb

## 2015-08-31 DIAGNOSIS — K219 Gastro-esophageal reflux disease without esophagitis: Secondary | ICD-10-CM | POA: Insufficient documentation

## 2015-08-31 DIAGNOSIS — Z85820 Personal history of malignant melanoma of skin: Secondary | ICD-10-CM | POA: Insufficient documentation

## 2015-08-31 DIAGNOSIS — F419 Anxiety disorder, unspecified: Secondary | ICD-10-CM | POA: Insufficient documentation

## 2015-08-31 DIAGNOSIS — E785 Hyperlipidemia, unspecified: Secondary | ICD-10-CM | POA: Insufficient documentation

## 2015-08-31 DIAGNOSIS — Z510 Encounter for antineoplastic radiation therapy: Secondary | ICD-10-CM | POA: Insufficient documentation

## 2015-08-31 DIAGNOSIS — Z79899 Other long term (current) drug therapy: Secondary | ICD-10-CM | POA: Insufficient documentation

## 2015-08-31 DIAGNOSIS — C61 Malignant neoplasm of prostate: Secondary | ICD-10-CM | POA: Insufficient documentation

## 2015-08-31 DIAGNOSIS — I10 Essential (primary) hypertension: Secondary | ICD-10-CM | POA: Insufficient documentation

## 2015-08-31 NOTE — Consult Note (Signed)
Except an outstanding is perfect of Radiation Oncology NEW PATIENT EVALUATION  Name: Raymond Christian  MRN: [id number]  Date:   08/31/2015     DOB: September 05, 1941   This 74 y.o. 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  REFERRING PHYSICIAN: Sherrin Daisy, MD  CHIEF COMPLAINT:  Chief Complaint  Patient presents with  . Prostate Cancer    Pt is here for initial consultation for prostate cancer.     DIAGNOSIS: The encounter diagnosis was Malignant neoplasm of prostate ([place]).   PREVIOUS INVESTIGATIONS:  Surgical pathology report reviewed Clinical notes reviewed No bone scan based on low PSA value  HPI: Patient is a 74 year old male now out over 5 years having completed radiation therapy to his posterior left neck status post wide local excision for malignant melanoma. He is been followed by urology had a PSA of 4.3 prompting transrectal ultrasound-guided biopsy showing bilateral involvement of mostly Gleason 6 (3+3 adenocarcinoma with 1 core positive for Gleason 7 (3+4). Patient is fairly asymptomatic has nocturia 2 no evidence of urinary incontinence significant frequency of her or urgency. He does complain still some tightness in his left neck from prior surgery and radiation. He's been advised of treatment options is seen today for consideration of radiation therapy. He has not had a bone scan performed based on his low PSA and low probability of any metastatic involvement of bone.   PLANNED TREATMENT REGIMEN: Image guided I MRT radiation therapy  PAST MEDICAL HISTORY:  has a past medical history of Essential (primary) hypertension (07/09/2014); Gastro-esophageal reflux disease without esophagitis (07/09/2014); Anxiety (07/09/2014); HLD (hyperlipidemia) (07/09/2014); and Malignant melanoma ([place]) (07/09/2014).    PAST SURGICAL HISTORY:  Past Surgical History  Procedure  Laterality Date  . Hernia repair      x2  . Melanoma excision      FAMILY HISTORY: family history includes Diabetes in his sister.  SOCIAL HISTORY:  reports that he has never smoked. He does not have any smokeless tobacco history on file. He reports that he drinks alcohol. He reports that he does not use illicit drugs.  ALLERGIES: Review of patient's allergies indicates no known allergies.  MEDICATIONS:  Current Outpatient Prescriptions  Medication Sig Dispense Refill  . lisinopril-hydrochlorothiazide (PRINZIDE,ZESTORETIC) 20-12.5 MG tablet Take by mouth.    [name] omeprazole (PRILOSEC) 20 MG capsule Take by mouth.    . simvastatin (ZOCOR) 20 MG tablet Take by mouth.     No current facility-administered medications for this encounter.    ECOG PERFORMANCE STATUS:  0 - Asymptomatic  REVIEW OF SYSTEMS: Except for the tightness in his left neck from prior radiation and surgery Patient denies any weight loss, fatigue, weakness, fever, chills or night sweats. Patient denies any loss of vision, blurred vision. Patient denies any ringing  of the ears or hearing loss. No irregular heartbeat. Patient denies heart murmur or history of fainting. Patient denies any chest pain or pain radiating to her upper extremities. Patient denies any shortness of breath, difficulty breathing at night, cough or hemoptysis. Patient denies any swelling in the lower legs. Patient denies any nausea vomiting, vomiting of blood, or coffee ground material in the vomitus. Patient denies any stomach pain. Patient states has had normal bowel movements no significant constipation or diarrhea. Patient denies any dysuria, hematuria or significant nocturia. Patient denies any problems walking, swelling in the joints or loss  of balance. Patient denies any skin changes, loss of hair or loss of weight. Patient denies any excessive worrying or anxiety or significant depression. Patient denies any problems with insomnia. Patient denies  excessive thirst, polyuria, polydipsia. Patient denies any swollen glands, patient denies easy bruising or easy bleeding. Patient denies any recent infections, allergies or URI. Patient "s visual fields have not changed significantly in recent time.    PHYSICAL EXAM: BP 169/89 mmHg  Pulse 72  Temp(Src) 96.5 F (35.8 C)  Resp 20  Wt 177 lb 14.6 oz (80.7 kg) Well-developed male in NAD he does have sequela of prior radiation therapy to his left posterior neck with thickening of the skin in still some hyperpigmentation. On rectal exam rectal sphincter tone is good prostate is smooth without evidence of nodularity or mass sulcus is preserved bilaterally no other rectal abnormality is identified. Well-developed well-nourished patient in NAD. HEENT reveals PERLA, EOMI, discs not visualized.  Oral cavity is clear. No oral mucosal lesions are identified. Neck is clear without evidence of cervical or supraclavicular adenopathy. Lungs are clear to A&P. Cardiac examination is essentially unremarkable with regular rate and rhythm without murmur rub or thrill. Abdomen is benign with no organomegaly or masses noted. Motor sensory and DTR levels are equal and symmetric in the upper and lower extremities. Cranial nerves II through XII are grossly intact. Proprioception is intact. No peripheral adenopathy or edema is identified. No motor or sensory levels are noted. Crude visual fields are within normal range.  LABORATORY DATA: Pathology report reviewed    RADIOLOGY RESULTS: No bone scan performed based on low PSA value   IMPRESSION: Stage IIa adenocarcinoma the prostate in 74 year old male with previous history of melanoma  PLAN: At this time I got over treatment options which I would narrow down to watchful waiting versus external beam radiation therapy. Based on his 1 core of Gleason 7 may have a slightly more aggressive lesion. I discussed the overall long-term survival and disease-free survival with the  patient. Patient is interested in being treated. I have asked urology to place gold fiduciary markers for daily image guided treatment. I will plan on delivering 8000 cGy using I am RT treatment planning and delivery over 8 weeks. Risks and benefits of treatment including increased lower urinary tract symptoms, possible diarrhea, fatigue, alteration of blood counts, all were discussed in detail with the patient and his wife. They both seem to comprehend my treatment plan well. I have set up CT simulation and ordered that's shortly after markers are placed.  I would like to take this opportunity for allowing me to participate in the care of your patient.Armstead Peaks., MD

## 2015-09-04 ENCOUNTER — Emergency Department: Payer: Commercial Managed Care - PPO

## 2015-09-04 ENCOUNTER — Encounter: Payer: Self-pay | Admitting: Emergency Medicine

## 2015-09-04 ENCOUNTER — Emergency Department
Admission: EM | Admit: 2015-09-04 | Discharge: 2015-09-04 | Disposition: A | Discharge status: Home / Self Care | Payer: Commercial Managed Care - PPO | Attending: Emergency Medicine | Admitting: Emergency Medicine

## 2015-09-04 DIAGNOSIS — N200 Calculus of kidney: Secondary | ICD-10-CM | POA: Diagnosis not present

## 2015-09-04 DIAGNOSIS — I10 Essential (primary) hypertension: Secondary | ICD-10-CM | POA: Insufficient documentation

## 2015-09-04 DIAGNOSIS — R319 Hematuria, unspecified: Secondary | ICD-10-CM

## 2015-09-04 DIAGNOSIS — N390 Urinary tract infection, site not specified: Secondary | ICD-10-CM | POA: Insufficient documentation

## 2015-09-04 DIAGNOSIS — R109 Unspecified abdominal pain: Secondary | ICD-10-CM

## 2015-09-04 DIAGNOSIS — R1031 Right lower quadrant pain: Secondary | ICD-10-CM | POA: Diagnosis present

## 2015-09-04 HISTORY — DX: Malignant neoplasm of prostate: C61

## 2015-09-04 LAB — URINALYSIS COMPLETE WITH MICROSCOPIC (ARMC ONLY)
Bilirubin Urine: NEGATIVE
Glucose, UA: NEGATIVE mg/dL
Ketones, ur: NEGATIVE mg/dL
Leukocytes, UA: NEGATIVE
Nitrite: NEGATIVE
PH: 5 (ref 5.0–8.0)
PROTEIN: 30 mg/dL — AB
SQUAMOUS EPITHELIAL / LPF: NONE SEEN
Specific Gravity, Urine: 1.015 (ref 1.005–1.030)

## 2015-09-04 LAB — COMPREHENSIVE METABOLIC PANEL
ALK PHOS: 39 U/L (ref 38–126)
ALT: 24 U/L (ref 17–63)
ANION GAP: 7 (ref 5–15)
AST: 27 U/L (ref 15–41)
Albumin: 4.4 g/dL (ref 3.5–5.0)
BILIRUBIN TOTAL: 0.8 mg/dL (ref 0.3–1.2)
BUN: 27 mg/dL — ABNORMAL HIGH (ref 6–20)
CALCIUM: 9.5 mg/dL (ref 8.9–10.3)
CO2: 29 mmol/L (ref 22–32)
Chloride: 101 mmol/L (ref 101–111)
Creatinine, Ser: 1.25 mg/dL — ABNORMAL HIGH (ref 0.61–1.24)
GFR calc Af Amer: >60 mL/min (ref >60)
GFR, EST NON AFRICAN AMERICAN: 55 mL/min — AB (ref >60)
Glucose, Bld: 170 mg/dL — ABNORMAL HIGH (ref 65–99)
POTASSIUM: 4.1 mmol/L (ref 3.5–5.1)
SODIUM: 137 mmol/L (ref 135–145)
TOTAL PROTEIN: 7.7 g/dL (ref 6.5–8.1)

## 2015-09-04 LAB — CBC
HCT: 42.7 % (ref 40.0–52.0)
HEMOGLOBIN: 15 g/dL (ref 13.0–18.0)
MCH: 33 pg (ref 26.0–34.0)
MCHC: 35.2 g/dL (ref 32.0–36.0)
MCV: 93.9 fL (ref 80.0–100.0)
PLATELETS: 204 10*3/uL (ref 150–440)
RBC: 4.55 MIL/uL (ref 4.40–5.90)
RDW: 13.7 % (ref 11.5–14.5)
WBC: 6.6 10*3/uL (ref 3.8–10.6)

## 2015-09-04 MED ORDER — MORPHINE SULFATE (PF) 4 MG/ML IV SOLN
4.0000 mg | Freq: Once | INTRAVENOUS | Status: AC
  Administered 2015-09-04: 4 mg via INTRAVENOUS
  Filled 2015-09-04: qty 1

## 2015-09-04 MED ORDER — TAMSULOSIN HCL 0.4 MG PO CAPS: 0.4000 mg | ORAL_CAPSULE | Freq: Every day | ORAL | Status: DC

## 2015-09-04 MED ORDER — ONDANSETRON HCL 4 MG/2ML IJ SOLN
4.0000 mg | Freq: Once | INTRAMUSCULAR | Status: AC
  Administered 2015-09-04: 4 mg via INTRAVENOUS
  Filled 2015-09-04: qty 2

## 2015-09-04 MED ORDER — MORPHINE SULFATE (PF) 4 MG/ML IV SOLN
4.0000 mg | Freq: Once | INTRAVENOUS | Status: AC
  Administered 2015-09-04: 4 mg via INTRAVENOUS

## 2015-09-04 MED ORDER — MORPHINE SULFATE (PF) 4 MG/ML IV SOLN
INTRAVENOUS | Status: AC
  Administered 2015-09-04: 4 mg via INTRAVENOUS
  Filled 2015-09-04: qty 1

## 2015-09-04 MED ORDER — SODIUM CHLORIDE 0.9 % IV BOLUS (SEPSIS)
1000.0000 mL | Freq: Once | INTRAVENOUS | Status: AC
  Administered 2015-09-04: 1000 mL via INTRAVENOUS

## 2015-09-04 MED ORDER — OXYCODONE-ACETAMINOPHEN 5-325 MG/5ML PO SOLN: 5.0000 mL | Freq: Four times a day (QID) | ORAL | Status: DC | PRN

## 2015-09-04 MED ORDER — OXYCODONE-ACETAMINOPHEN 5-325 MG PO TABS
1.0000 | ORAL_TABLET | Freq: Once | ORAL | Status: AC
  Administered 2015-09-04: 1 via ORAL
  Filled 2015-09-04: qty 1

## 2015-09-04 MED ORDER — OXYCODONE-ACETAMINOPHEN 5-325 MG PO TABS: 1.0000 | ORAL_TABLET | Freq: Four times a day (QID) | ORAL | Status: DC | PRN

## 2015-09-04 MED ORDER — DEXTROSE 5 % IV SOLN
1.0000 g | Freq: Once | INTRAVENOUS | Status: AC
  Administered 2015-09-04: 1 g via INTRAVENOUS
  Filled 2015-09-04: qty 10

## 2015-09-04 MED ORDER — KETOROLAC TROMETHAMINE 30 MG/ML IJ SOLN
30.0000 mg | Freq: Once | INTRAMUSCULAR | Status: DC
  Filled 2015-09-04: qty 1

## 2015-09-04 MED ORDER — CIPROFLOXACIN HCL 500 MG PO TABS: 500.0000 mg | ORAL_TABLET | Freq: Two times a day (BID) | ORAL | Status: DC

## 2015-09-04 MED ORDER — ONDANSETRON 4 MG PO TBDP: 4.0000 mg | ORAL_TABLET | Freq: Three times a day (TID) | ORAL | Status: DC | PRN

## 2015-09-04 NOTE — ED Notes (Signed)
Pt. Back from CT.

## 2015-09-04 NOTE — ED Provider Notes (Signed)
[facility] Emergency Department Provider Note  ____________________________________________  Time seen: Approximately 508 AM  I have reviewed the triage vital signs and the nursing notes.   HISTORY  Chief Complaint Abdominal Pain    HPI Raymond Christian is a 74 y.o. male who comes into the hospital today requesting a pain shot. The patient reports that he has pain in his right lower quadrant. The pain started in the patient's right flank and went around to his abdomen. The patient reports that the pain started 1-2 hours before he arrived and seemed to get worse and worse. The patient has never had anything like this before.The patient rates his pain a 10 out of 10 in intensity. He reports that he was vomiting up some sour stuff prior to coming here. The patient feels sweaty and cold. He denies any shortness of breath or chest pain he also denies any blood in his urine or pain with urination. The patient had some biopsies taken from his prostate recently but has not had any problems with that since.    Past Medical History  Diagnosis Date  . Essential (primary) hypertension 07/09/2014  . Gastro-esophageal reflux disease without esophagitis 07/09/2014  . Anxiety 07/09/2014  . HLD (hyperlipidemia) 07/09/2014  . Malignant melanoma ([place]) 07/09/2014  . Prostate cancer ([place]) 06/2015    Patient Active Problem List   Diagnosis Date Noted  . Anxiety 07/09/2014  . Essential (primary) hypertension 07/09/2014  . Gastro-esophageal reflux disease without esophagitis 07/09/2014  . HLD (hyperlipidemia) 07/09/2014  . Malignant melanoma ([place]) 07/09/2014    Past Surgical History  Procedure Laterality Date  . Hernia repair      x2  . Melanoma excision      Current Outpatient Rx  Name  Route  Sig  Dispense  Refill  . lisinopril-hydrochlorothiazide (PRINZIDE,ZESTORETIC) 20-12.5 MG tablet   Oral   Take by mouth.         [name] omeprazole (PRILOSEC) 20 MG capsule   Oral  Take by mouth.         . simvastatin (ZOCOR) 20 MG tablet   Oral   Take by mouth.           Allergies Review of patient's allergies indicates no known allergies.  Family History  Problem Relation Age of Onset  . Diabetes Sister     Social History Social History  Substance Use Topics  . Smoking status: Never Smoker   . Smokeless tobacco: Not on file  . Alcohol Use: 0.0 oz/week    0 Standard drinks or equivalent per week    Review of Systems Constitutional: No fever/chills Eyes: No visual changes. ENT: No sore throat. Cardiovascular: Denies chest pain. Respiratory: Denies shortness of breath. Gastrointestinal: Right lower quadrant abdominal pain.  Nausea and vomiting.  No diarrhea.  No constipation. Genitourinary: Negative for dysuria. Musculoskeletal: Right flank pain. Skin: Negative for rash. Neurological: Negative for headaches, focal weakness or numbness.  10-point ROS otherwise negative.  ____________________________________________   PHYSICAL EXAM:  VITAL SIGNS: ED Triage Vitals  Enc Vitals Group     BP 09/04/15 0500 161/77 mmHg     Pulse Rate 09/04/15 0500 58     Resp 09/04/15 0500 22     Temp 09/04/15 0621 97.2 F (36.2 C)     Temp Source 09/04/15 0500 Oral     SpO2 09/04/15 0500 91 %     Weight 09/04/15 0500 177 lb (80.287 kg)     Height 09/04/15 0500 5\' 8"  (  1.727 m)     Head Cir --      Peak Flow --      Pain Score 09/04/15 0526 10     Pain Loc --      Pain Edu? --      Excl. in [place]? --     Constitutional: Alert and oriented. Well appearing and in moderate distress. Eyes: Conjunctivae are normal. PERRL. EOMI. Head: Atraumatic. Nose: No congestion/rhinnorhea. Mouth/Throat: Mucous membranes are moist.  Oropharynx non-erythematous. Cardiovascular: Normal rate, regular rhythm. Grossly normal heart sounds.  Good peripheral circulation. Respiratory: Normal respiratory effort.  No retractions. Lungs CTAB. Gastrointestinal: Soft with right  lower quadrant and right flank tenderness to palpation. No distention. Positive bowel sounds Musculoskeletal: No lower extremity tenderness nor edema.   Neurologic:  Normal speech and language. No gross focal neurologic deficits are appreciated.  Skin:  Skin is warm, dry and intact. Psychiatric: Mood and affect are normal.   ____________________________________________   LABS (all labs ordered are listed, but only abnormal results are displayed)  Labs Reviewed  COMPREHENSIVE METABOLIC PANEL - Abnormal; Notable for the following:    Glucose, Bld 170 (*)    BUN 27 (*)    Creatinine, Ser 1.25 (*)    GFR calc non Af Amer 55 (*)    All other components within normal limits  CBC  URINALYSIS COMPLETEWITH MICROSCOPIC ([facility] ONLY)   ____________________________________________  EKG  None ____________________________________________  RADIOLOGY  CT renal stone study: 5 mm proximal right ureteral calculus with moderate hydronephrosis, bilateral nephrolithiasis, cholelithiasis, diverticulosis, 3 cm abdominal aortic aneurysm, 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 Large hiatal hernia ____________________________________________   PROCEDURES  Procedure(s) performed: None  Critical Care performed: No  ____________________________________________   INITIAL IMPRESSION / ASSESSMENT AND PLAN / ED COURSE  Pertinent labs & imaging results that were available during my care of the patient were reviewed by me and considered in my medical decision making (see chart for details).  This is a 74 year old man who comes in today with right-sided pain that radiates from his back to his abdomen. The patient had a CT scan which shows right kidney stone. The patient received a liter of normal saline and 3 doses of morphine prior to resolution of his pain. Patient's white blood cell count is not elevated but this time we're waiting the results of the  patient's urinalysis to determine if he has an infected stone. The patient's care was signed out to Dr. Lerry Paterson who will follow-up the results of the urinalysis and disposition the patient. ____________________________________________   FINAL CLINICAL IMPRESSION(S) / ED DIAGNOSES  Final diagnoses:  Right sided abdominal pain      Loney Hering, MD 09/04/15 [phone]

## 2015-09-04 NOTE — ED Notes (Signed)
Pt. States lower rt. Back pain that radiates to lower rt. Abdominal pain.  Pt. States he vomited before getting here.  Pt. States he was dx with prostate cancer and will be starting treatment soon.

## 2015-09-04 NOTE — ED Notes (Addendum)
Pt's wife reports she had the pt's sons, who are EMTs take his blood pressure this morning before coming to the hospital.  She reported it to be 80/60

## 2015-09-04 NOTE — ED Notes (Signed)
Patient reports having right lower quad pain for approximately 2 hours. +nausea.

## 2015-09-04 NOTE — Discharge Instructions (Signed)
Kidney Stones °Kidney stones (urolithiasis) are deposits that form inside your kidneys. The intense pain is caused by the stone moving through the urinary tract. When the stone moves, the ureter goes into spasm around the stone. The stone is usually passed in the urine.  °CAUSES  °· A disorder that makes certain neck glands produce too much parathyroid hormone (primary hyperparathyroidism). °· A buildup of uric acid crystals, similar to gout in your joints. °· Narrowing (stricture) of the ureter. °· A kidney obstruction present at birth (congenital obstruction). °· Previous surgery on the kidney or ureters. °· Numerous kidney infections. °SYMPTOMS  °· Feeling sick to your stomach (nauseous). °· Throwing up (vomiting). °· Blood in the urine (hematuria). °· Pain that usually spreads (radiates) to the groin. °· Frequency or urgency of urination. °DIAGNOSIS  °· Taking a history and physical exam. °· Blood or urine tests. °· CT scan. °· Occasionally, an examination of the inside of the urinary bladder (cystoscopy) is performed. °TREATMENT  °· Observation. °· Increasing your fluid intake. °· Extracorporeal shock wave lithotripsy--This is a noninvasive procedure that uses shock waves to break up kidney stones. °· Surgery may be needed if you have severe pain or persistent obstruction. There are various surgical procedures. Most of the procedures are performed with the use of small instruments. Only small incisions are needed to accommodate these instruments, so recovery time is minimized. °The size, location, and chemical composition are all important variables that will determine the proper choice of action for you. Talk to your health care provider to better understand your situation so that you will minimize the risk of injury to yourself and your kidney.  °HOME CARE INSTRUCTIONS  °· Drink enough water and fluids to keep your urine clear or pale yellow. This will help you to pass the stone or stone fragments. °· Strain  all urine through the provided strainer. Keep all particulate matter and stones for your health care provider to see. The stone causing the pain may be as small as a grain of salt. It is very important to use the strainer each and every time you pass your urine. The collection of your stone will allow your health care provider to analyze it and verify that a stone has actually passed. The stone analysis will often identify what you can do to reduce the incidence of recurrences. °· Only take over-the-counter or prescription medicines for pain, discomfort, or fever as directed by your health care provider. °· Keep all follow-up visits as told by your health care provider. This is important. °· Get follow-up X-rays if required. The absence of pain does not always mean that the stone has passed. It may have only stopped moving. If the urine remains completely obstructed, it can cause loss of kidney function or even complete destruction of the kidney. It is your responsibility to make sure X-rays and follow-ups are completed. Ultrasounds of the kidney can show blockages and the status of the kidney. Ultrasounds are not associated with any radiation and can be performed easily in a matter of minutes. °· Make changes to your daily diet as told by your health care provider. You may be told to: °¨ Limit the amount of salt that you eat. °¨ Eat 5 or more servings of fruits and vegetables each day. °¨ Limit the amount of meat, poultry, fish, and eggs that you eat. °· Collect a 24-hour urine sample as told by your health care provider. You may need to collect another urine sample every 6-12   months. SEEK MEDICAL CARE IF:  You experience pain that is progressive and unresponsive to any pain medicine you have been prescribed. SEEK IMMEDIATE MEDICAL CARE IF:   Pain cannot be controlled with the prescribed medicine.  You have a fever or shaking chills.  The severity or intensity of pain increases over 18 hours and is not  relieved by pain medicine.  You develop a new onset of abdominal pain.  You feel faint or pass out.  You are unable to urinate.   This information is not intended to replace advice given to you by your health care provider. Make sure you discuss any questions you have with your health care provider.   Document Released: 10/15/2005 Document Revised: 07/06/2015 Document Reviewed: 03/18/2013 Elsevier Interactive Patient Education 2016 [employer]  Flank Pain Flank pain refers to pain that is located on the side of the body between the upper abdomen and the back. The pain may occur over a short period of time (acute) or may be long-term or reoccurring (chronic). It may be mild or severe. Flank pain can be caused by many things. CAUSES  Some of the more common causes of flank pain include:  Muscle strains.   Muscle spasms.   A disease of your spine (vertebral disk disease).   A lung infection (pneumonia).   Fluid around your lungs (pulmonary edema).   A kidney infection.   Kidney stones.   A very painful skin rash caused by the chickenpox virus (shingles).   Gallbladder disease.  [place] care will depend on the cause of your pain. In general,  Rest as directed by your caregiver.  Drink enough fluids to keep your urine clear or pale yellow.  Only take over-the-counter or prescription medicines as directed by your caregiver. Some medicines may help relieve the pain.  Tell your caregiver about any changes in your pain.  Follow up with your caregiver as directed. SEEK IMMEDIATE MEDICAL CARE IF:   Your pain is not controlled with medicine.   You have new or worsening symptoms.  Your pain increases.   You have abdominal pain.   You have shortness of breath.   You have persistent nausea or vomiting.   You have swelling in your abdomen.   You feel faint or pass out.   You have blood in your urine.  You have a fever or  persistent symptoms for more than 2-3 days.  You have a fever and your symptoms suddenly get worse. MAKE SURE YOU:   Understand these instructions.  Will watch your condition.  Will get help right away if you are not doing well or get worse.   This information is not intended to replace advice given to you by your health care provider. Make sure you discuss any questions you have with your health care provider.   Document Released: 12/06/2005 Document Revised: 07/09/2012 Document Reviewed: 05/29/2012 Elsevier Interactive Patient Education 2016 Elsevier Inc.  Urinary Tract Infection Urinary tract infections (UTIs) can develop anywhere along your urinary tract. Your urinary tract is your body's drainage system for removing wastes and extra water. Your urinary tract includes two kidneys, two ureters, a bladder, and a urethra. Your kidneys are a pair of bean-shaped organs. Each kidney is about the size of your fist. They are located below your ribs, one on each side of your spine. CAUSES Infections are caused by microbes, which are microscopic organisms, including fungi, viruses, and bacteria. These organisms are so small that they  can only be seen through a microscope. Bacteria are the microbes that most commonly cause UTIs. SYMPTOMS  Symptoms of UTIs may vary by age and gender of the patient and by the location of the infection. Symptoms in young women typically include a frequent and intense urge to urinate and a painful, burning feeling in the bladder or urethra during urination. Older women and men are more likely to be tired, shaky, and weak and have muscle aches and abdominal pain. A fever may mean the infection is in your kidneys. Other symptoms of a kidney infection include pain in your back or sides below the ribs, nausea, and vomiting. DIAGNOSIS To diagnose a UTI, your caregiver will ask you about your symptoms. Your caregiver will also ask you to provide a urine sample. The urine  sample will be tested for bacteria and white blood cells. White blood cells are made by your body to help fight infection. TREATMENT  Typically, UTIs can be treated with medication. Because most UTIs are caused by a bacterial infection, they usually can be treated with the use of antibiotics. The choice of antibiotic and length of treatment depend on your symptoms and the type of bacteria causing your infection. HOME CARE INSTRUCTIONS  If you were prescribed antibiotics, take them exactly as your caregiver instructs you. Finish the medication even if you feel better after you have only taken some of the medication.  Drink enough water and fluids to keep your urine clear or pale yellow.  Avoid caffeine, tea, and carbonated beverages. They tend to irritate your bladder.  Empty your bladder often. Avoid holding urine for long periods of time.  Empty your bladder before and after sexual intercourse.  After a bowel movement, women should cleanse from front to back. Use each tissue only once. SEEK MEDICAL CARE IF:   You have back pain.  You develop a fever.  Your symptoms do not begin to resolve within 3 days. SEEK IMMEDIATE MEDICAL CARE IF:   You have severe back pain or lower abdominal pain.  You develop chills.  You have nausea or vomiting.  You have continued burning or discomfort with urination. MAKE SURE YOU:   Understand these instructions.  Will watch your condition.  Will get help right away if you are not doing well or get worse.   This information is not intended to replace advice given to you by your health care provider. Make sure you discuss any questions you have with your health care provider.   Document Released: 07/25/2005 Document Revised: 07/06/2015 Document Reviewed: 11/23/2011 Elsevier Interactive Patient Education [employer].

## 2015-09-04 NOTE — ED Notes (Addendum)
Pt diagnosed with prostate cancer in September.  Pt reports he is supposed to begin treatment end of Nov/beginning of Dec.

## 2015-09-04 NOTE — ED Provider Notes (Signed)
Patient accepted from Dr. Dahlia Client. Patient's urinalysis shows too numerous to count red blood cells as well as bacteria and white blood cells. He will be treated for urinary tract infection associated with proximal 5 mm stone. He is symptomatically controlled at this point in time. He is stable with no fever, no renal failure, and is able to be treated as an outpatient. I made this decision after discussion with his urologist Dr.Budzyn.  Patient is to call office tomorrow morning and make an appointment this week.  We discussed return precautions including fever, vomiting, dizziness or passing out, worsening pain, or any other symptoms concerning to himself or his family.  Lisa Roca, MD 09/04/15 [phone]

## 2015-09-04 NOTE — ED Notes (Signed)
Patient transported to CT 

## 2015-09-06 ENCOUNTER — Ambulatory Visit (INDEPENDENT_AMBULATORY_CARE_PROVIDER_SITE_OTHER): Payer: Commercial Managed Care - PPO | Admitting: Urology

## 2015-09-06 ENCOUNTER — Encounter: Payer: Self-pay | Admitting: Urology

## 2015-09-06 VITALS — BP 153/84 | HR 80 | Ht 68.0 in | Wt 180.3 lb

## 2015-09-06 DIAGNOSIS — N201 Calculus of ureter: Secondary | ICD-10-CM | POA: Diagnosis not present

## 2015-09-06 DIAGNOSIS — N132 Hydronephrosis with renal and ureteral calculous obstruction: Secondary | ICD-10-CM | POA: Diagnosis not present

## 2015-09-06 DIAGNOSIS — R3129 Other microscopic hematuria: Secondary | ICD-10-CM

## 2015-09-06 DIAGNOSIS — C61 Malignant neoplasm of prostate: Secondary | ICD-10-CM

## 2015-09-06 LAB — URINALYSIS, COMPLETE
BILIRUBIN UA: NEGATIVE
GLUCOSE, UA: NEGATIVE
Ketones, UA: NEGATIVE
Leukocytes, UA: NEGATIVE
NITRITE UA: NEGATIVE
PH UA: 5.5 (ref 5.0–7.5)
PROTEIN UA: NEGATIVE
Specific Gravity, UA: 1.015 (ref 1.005–1.030)
UUROB: 0.2 mg/dL (ref 0.2–1.0)

## 2015-09-06 LAB — URINE CULTURE: Culture: 1000

## 2015-09-06 LAB — MICROSCOPIC EXAMINATION: Epithelial Cells (non renal): NONE SEEN /hpf (ref 0–10)

## 2015-09-06 MED ORDER — OXYCODONE-ACETAMINOPHEN 5-325 MG PO TABS: 1.0000 | ORAL_TABLET | ORAL | Status: DC | PRN

## 2015-09-06 MED ORDER — TAMSULOSIN HCL 0.4 MG PO CAPS: 0.4000 mg | ORAL_CAPSULE | Freq: Every day | ORAL | Status: DC

## 2015-09-06 NOTE — Progress Notes (Signed)
09/06/2015 4:33 PM   Raymond Christian [birth year] [id number]  Referring provider: Sherrin Daisy, MD [address], [place] [address]  Chief Complaint  Patient presents with  . 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    Today, his pain has not changed location.  It is still in the right flank radiating to the right side.  The pain waxes and wanes, lasted for several minutes to an hour at a time.  He has not taken the Roxicet, but he is taking ibuprofen.  He states the ibuprofen eases the pain.  He has taken 6 ibuprofen tablets since 07/05/2015.   He has not experienced any more nausea or vomiting.  He denies any fevers or chills.  He has not passed a fragment to his knowledge.  His UA today is positive for microscopic hematuria with 3-10 RBC's/hpf.    He is not experiencing gross hematuria, dysuria or suprapubic pain.  He does not have a prior history of stones.  I have reviewed the films with the patient.    PMH: Past Medical History  Diagnosis Date  . Essential (primary) hypertension 07/09/2014  . Gastro-esophageal reflux disease without esophagitis 07/09/2014  . Anxiety 07/09/2014  . HLD (hyperlipidemia) 07/09/2014  . Malignant melanoma ([place]) 07/09/2014  . Prostate cancer ([place]) 06/2015    Surgical History: Past Surgical History  Procedure Laterality Date  . Hernia repair      x2  . Melanoma excision      Home Medications:    Medication List       This list is accurate as of: 09/06/15  4:33 PM.  Always use your most recent med list.                 ciprofloxacin 500 MG tablet  Commonly known as:  CIPRO  Take 1 tablet (500 mg total) by mouth 2 (two) times daily.     lisinopril-hydrochlorothiazide 20-12.5 MG tablet  Commonly known as:  PRINZIDE,ZESTORETIC  Take 1 tablet by mouth daily.     omeprazole 20 MG capsule  Commonly known as:  PRILOSEC  Take 20 mg by mouth every other day.     ondansetron 4 MG disintegrating tablet  Commonly known as:  ZOFRAN ODT  Take 1 tablet (4 mg total) by mouth every 8 (eight) hours as needed for nausea or vomiting.     oxyCODONE-acetaminophen 5-325 MG tablet  Commonly known as:  ROXICET  Take 1 tablet by mouth every 6 (six) hours as needed for severe pain.     oxyCODONE-acetaminophen 5-325 MG tablet  Commonly known as:  ROXICET  Take 1 tablet by mouth every 4 (four) hours as needed for severe pain.     simvastatin 20 MG tablet  Commonly known as:  ZOCOR  Take 20 mg by mouth at bedtime.     tamsulosin 0.4 MG Caps capsule  Commonly known as:  FLOMAX  Take 1 capsule (0.4 mg total) by mouth daily.     tamsulosin 0.4 MG Caps capsule  Commonly known  as:  FLOMAX  Take 1 capsule (0.4 mg total) by mouth daily.        Allergies: No Known Allergies  Family History: Family History  Problem Relation Age of Onset  . Diabetes Sister   . Kidney disease Sister     Social History:  reports that he has never smoked. He does not have any smokeless tobacco history on file. He reports that he does not drink alcohol or use illicit drugs.  ROS: UROLOGY Frequent Urination?: No Hard to postpone urination?: No Burning/pain with urination?: No Get up at night to urinate?: Yes Leakage of urine?: No Urine stream starts and stops?: No Trouble starting stream?: No Do you have to strain to urinate?: No Blood in urine?: No Urinary tract infection?: No Sexually transmitted disease?: No Injury to kidneys or bladder?: No Painful intercourse?: No Weak stream?: No Erection problems?: No Penile  pain?: No  Gastrointestinal Nausea?: No Vomiting?: No Indigestion/heartburn?: No Diarrhea?: No Constipation?: No  Constitutional Fever: No Night sweats?: No Weight loss?: No Fatigue?: No  Skin Skin rash/lesions?: No Itching?: No  Eyes Blurred vision?: No Double vision?: No  Ears/Nose/Throat Sore throat?: No Sinus problems?: No  Hematologic/Lymphatic Swollen glands?: No Easy bruising?: Yes  Cardiovascular Leg swelling?: No Chest pain?: No  Respiratory Cough?: No Shortness of breath?: No  Endocrine Excessive thirst?: No  Musculoskeletal Back pain?: Yes Joint pain?: No  Neurological Headaches?: No Dizziness?: No  Psychologic Depression?: No Anxiety?: No  Physical Exam: BP 153/84 mmHg  Pulse 80  Ht '5\' 8"'  (1.727 m)  Wt 180 lb 4.8 oz (81.784 kg)  BMI 27.42 kg/m2  Constitutional: Well nourished. Alert and oriented, No acute distress. HEENT: [place] AT, moist mucus membranes. Trachea midline, no masses. Cardiovascular: No clubbing, cyanosis, or edema. Respiratory: Normal respiratory effort, no increased work of breathing. GI: Abdomen is soft, non tender, non distended, no abdominal masses. Liver and spleen not palpable.  No hernias appreciated.  Stool sample for occult testing is not indicated.   GU: No CVA tenderness.  No bladder fullness or masses.   Skin: No rashes, bruises or suspicious lesions. Lymph: No cervical or inguinal adenopathy. Neurologic: Grossly intact, no focal deficits, moving all 4 extremities. Psychiatric: Normal mood and affect.  Laboratory Data: Lab Results  Component Value Date   WBC 6.6 09/04/2015   HGB 15.0 09/04/2015   HCT 42.7 09/04/2015   MCV 93.9 09/04/2015   PLT 204 09/04/2015    Lab Results  Component Value Date   CREATININE 1.25* 09/04/2015    Lab Results  Component Value Date   PSA 4.3* 08/01/2015   PSA 2.8 08/06/2012   PSA 4.4* 01/23/2012    No results found for: TESTOSTERONE  Lab Results  Component  Value Date   HGBA1C 6.0 12/24/2013    Urinalysis Results for orders placed or performed in visit on 09/06/15  Microscopic Examination  Result Value Ref Range   WBC, UA 0-5 0 -  5 /hpf   RBC, UA 3-10 (A) 0 -  2 /hpf   Epithelial Cells (non renal) None seen 0 - 10 /hpf   Bacteria, UA Few None seen/Few  Urinalysis, Complete  Result Value Ref Range   Specific Gravity, UA 1.015 1.005 - 1.030   pH, UA 5.5 5.0 - 7.5   Color, UA Yellow Yellow   Appearance Ur Clear Clear   Leukocytes, UA Negative Negative   Protein, UA Negative Negative/Trace   Glucose, UA Negative Negative   Ketones, UA Negative Negative  RBC, UA 2+ (A) Negative   Bilirubin, UA Negative Negative   Urobilinogen, Ur 0.2 0.2 - 1.0 mg/dL   Nitrite, UA Negative Negative   Microscopic Examination See below:      Pertinent Imaging: CLINICAL DATA: Severe low right lower back pain. Vomiting.  EXAM: CT ABDOMEN AND PELVIS WITHOUT CONTRAST  TECHNIQUE: Multidetector CT imaging of the abdomen and pelvis was performed following the standard protocol without IV contrast.  COMPARISON: 12/23/2013  FINDINGS: There is a 5 mm proximal right ureteral calculus at the low L3 level with moderate hydronephrosis. Additional 2-3 mm collecting system calculi are present elsewhere in both kidneys. Multiple cysts are present in the right kidney, not conclusively characterized in the absence of intravenous contrast.  There is a gallbladder calculus. There are unremarkable unenhanced appearances of the liver and bile ducts. The spleen, pancreas and adrenals appear unremarkable. 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 There is no evidence of aneurysm rupture or retroperitoneal hemorrhage.  There is a large hiatal hernia. The appendix is normal. There is uncomplicated colonic diverticulosis.  No significant skeletal lesion. No significant abnormality in  the lower chest except for the hiatal hernia.  IMPRESSION: 1. 5 mm proximal right ureteral calculus with moderate hydronephrosis. Bilateral nephrolithiasis. 2. Cholelithiasis. 3. Diverticulosis. 4. 3 cm abdominal aortic aneurysm. 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 Not significantly enlarged from 12/23/2013. No aneurysm rupture or retroperitoneal hemorrhage. 5. Large hiatal hernia.   Electronically Signed  By: Andreas Newport M.D.  On: 09/04/2015 06:24        Assessment & Plan:    1. Right ureteral stone:   Patient with a right 5 mm proximal ureteral calculus with moderate hydronephrosis.  We discussed MET vs ESWL vs URS/LL/ureteral stent placement.  I informed the patient of the success rate and risks involved with each option.  He would like to see if he can pass the stone over the weekend.  He is given a refill on his tamsulosin, Roxicet for pain and a strainer.  Patient will have a KUB on Monday morning.  I will call him with the results.    2. Hydronephrosis:   Once the stone has passed, a RUS will be obtained to confirm resolution of the hydronephrosis.    3. Microscopic hematuria:   We will continue to monitor to ensure resolution of the microscopic hematuria after the stone has passed.   4. 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  It is scheduled for the 15 th of this month.     Return for Monday with KUB.  Zara Council, [facility] [address], [place], [place] [address] [phone]

## 2015-09-10 ENCOUNTER — Emergency Department
Admission: EM | Admit: 2015-09-10 | Discharge: 2015-09-10 | Disposition: A | Discharge status: Home / Self Care | Payer: Commercial Managed Care - PPO | Attending: Emergency Medicine | Admitting: Emergency Medicine

## 2015-09-10 ENCOUNTER — Encounter: Payer: Self-pay | Admitting: Emergency Medicine

## 2015-09-10 ENCOUNTER — Emergency Department: Payer: Commercial Managed Care - PPO

## 2015-09-10 ENCOUNTER — Ambulatory Visit
Admission: EM | Admit: 2015-09-10 | Discharge: 2015-09-10 | Disposition: A | Discharge status: Other Acute Inpatient Hospital | Payer: Commercial Managed Care - PPO | Attending: Family Medicine | Admitting: Family Medicine

## 2015-09-10 DIAGNOSIS — Z79899 Other long term (current) drug therapy: Secondary | ICD-10-CM | POA: Insufficient documentation

## 2015-09-10 DIAGNOSIS — N201 Calculus of ureter: Secondary | ICD-10-CM | POA: Diagnosis not present

## 2015-09-10 DIAGNOSIS — Z792 Long term (current) use of antibiotics: Secondary | ICD-10-CM | POA: Diagnosis not present

## 2015-09-10 DIAGNOSIS — N390 Urinary tract infection, site not specified: Secondary | ICD-10-CM | POA: Diagnosis not present

## 2015-09-10 DIAGNOSIS — I10 Essential (primary) hypertension: Secondary | ICD-10-CM | POA: Insufficient documentation

## 2015-09-10 DIAGNOSIS — N289 Disorder of kidney and ureter, unspecified: Secondary | ICD-10-CM | POA: Diagnosis not present

## 2015-09-10 DIAGNOSIS — R3129 Other microscopic hematuria: Secondary | ICD-10-CM

## 2015-09-10 DIAGNOSIS — N23 Unspecified renal colic: Secondary | ICD-10-CM | POA: Diagnosis not present

## 2015-09-10 DIAGNOSIS — R7989 Other specified abnormal findings of blood chemistry: Secondary | ICD-10-CM | POA: Diagnosis present

## 2015-09-10 DIAGNOSIS — C61 Malignant neoplasm of prostate: Secondary | ICD-10-CM

## 2015-09-10 LAB — COMPREHENSIVE METABOLIC PANEL
ALT: 23 U/L (ref 17–63)
ANION GAP: 11 (ref 5–15)
AST: 6 U/L — ABNORMAL LOW (ref 15–41)
Albumin: 4.6 g/dL (ref 3.5–5.0)
Alkaline Phosphatase: 46 U/L (ref 38–126)
BUN: 29 mg/dL — ABNORMAL HIGH (ref 6–20)
CHLORIDE: 93 mmol/L — AB (ref 101–111)
CO2: 25 mmol/L (ref 22–32)
Calcium: 9.4 mg/dL (ref 8.9–10.3)
Creatinine, Ser: 2.07 mg/dL — ABNORMAL HIGH (ref 0.61–1.24)
GFR calc non Af Amer: 30 mL/min — ABNORMAL LOW (ref >60)
GFR, EST AFRICAN AMERICAN: 34 mL/min — AB (ref >60)
GLUCOSE: 122 mg/dL — AB (ref 65–99)
POTASSIUM: 4.4 mmol/L (ref 3.5–5.1)
SODIUM: 129 mmol/L — AB (ref 135–145)
Total Bilirubin: 0.8 mg/dL (ref 0.3–1.2)
Total Protein: 8.2 g/dL — ABNORMAL HIGH (ref 6.5–8.1)

## 2015-09-10 LAB — URINALYSIS COMPLETE WITH MICROSCOPIC (ARMC ONLY)
Bilirubin Urine: NEGATIVE
Glucose, UA: NEGATIVE mg/dL
HGB URINE DIPSTICK: NEGATIVE
Ketones, ur: NEGATIVE mg/dL
NITRITE: NEGATIVE
PROTEIN: NEGATIVE mg/dL
SPECIFIC GRAVITY, URINE: 1.025 (ref 1.005–1.030)
pH: 5 (ref 5.0–8.0)

## 2015-09-10 LAB — CBC WITH DIFFERENTIAL/PLATELET
BASOS ABS: 0.1 10*3/uL (ref 0–0.1)
Basophils Relative: 1 %
EOS ABS: 0.1 10*3/uL (ref 0–0.7)
EOS PCT: 1 %
HCT: 44 % (ref 40.0–52.0)
Hemoglobin: 15.4 g/dL (ref 13.0–18.0)
LYMPHS ABS: 0.9 10*3/uL — AB (ref 1.0–3.6)
Lymphocytes Relative: 10 %
MCH: 32.7 pg (ref 26.0–34.0)
MCHC: 35.1 g/dL (ref 32.0–36.0)
MCV: 93.3 fL (ref 80.0–100.0)
Monocytes Absolute: 0.8 10*3/uL (ref 0.2–1.0)
Monocytes Relative: 9 %
Neutro Abs: 6.9 10*3/uL — ABNORMAL HIGH (ref 1.4–6.5)
Neutrophils Relative %: 79 %
PLATELETS: 203 10*3/uL (ref 150–440)
RBC: 4.72 MIL/uL (ref 4.40–5.90)
RDW: 13.8 % (ref 11.5–14.5)
WBC: 8.7 10*3/uL (ref 3.8–10.6)

## 2015-09-10 MED ORDER — SULFAMETHOXAZOLE-TRIMETHOPRIM 400-80 MG PO TABS: 1.0000 | ORAL_TABLET | Freq: Two times a day (BID) | ORAL | Status: DC

## 2015-09-10 MED ORDER — HYDROMORPHONE HCL 1 MG/ML IJ SOLN
1.0000 mg | Freq: Once | INTRAMUSCULAR | Status: AC
  Administered 2015-09-10: 1 mg via INTRAMUSCULAR

## 2015-09-10 MED ORDER — OXYCODONE-ACETAMINOPHEN 7.5-325 MG PO TABS: 1.0000 | ORAL_TABLET | ORAL | Status: DC | PRN

## 2015-09-10 MED ORDER — IPRATROPIUM-ALBUTEROL 0.5-2.5 (3) MG/3ML IN SOLN: 3.0000 mL | Freq: Four times a day (QID) | RESPIRATORY_TRACT | Status: DC

## 2015-09-10 NOTE — ED Notes (Addendum)
Scheduled to go tuesday to begin prostate ca treatment

## 2015-09-10 NOTE — ED Provider Notes (Signed)
[facility] Emergency Department Provider Note  ____________________________________________  Time seen: 1340  I have reviewed the triage vital signs and the nursing notes.   HISTORY  Chief Complaint Nephrolithiasis     HPI Raymond Christian is a 74 y.o. male who was diagnosed with a renal stone in the right proximal ureter approximately 6 days ago. Since then he has seen a urologist, Dr. Erlene Quan. The patient's pain has continued and he feels it is not well-controlled. He has been prescribed oxycodone. He takes 1 pill on occasion without great benefit. Today he went to urgent care for further evaluation. They repeated some blood test that showed an increase in his creatinine and a subsequent decrease in his GFR. Due to this, they have referred him to the emergency department for further evaluation. While at urgent care he was given an injection to help control pain. At this time he is not complaining of pain.  The patient has an appointment for a plain film x-ray on Monday morning. The plan, as described by the patient, is for Dr. Erlene Quan to review this plan x-ray and then speak with him further by phone, ensuring that there is been movement of the stone.  In addition to his renal stone, the patient has a current and ongoing issue with prostate cancer. He is supposed to begin treatment for that this this coming week.  Past Medical History  Diagnosis Date  . Essential (primary) hypertension 07/09/2014  . Gastro-esophageal reflux disease without esophagitis 07/09/2014  . Anxiety 07/09/2014  . HLD (hyperlipidemia) 07/09/2014  . Malignant melanoma ([place]) 07/09/2014  . Prostate cancer ([place]) 06/2015  . Prostate cancer [facility])     Patient Active Problem List   Diagnosis Date Noted  . Right ureteral stone 09/06/2015  . Hydronephrosis with urinary obstruction due to ureteral calculus 09/06/2015  . Microscopic hematuria 09/06/2015  . Prostate cancer ([place]) 09/06/2015  .  Anxiety 07/09/2014  . Essential (primary) hypertension 07/09/2014  . Gastro-esophageal reflux disease without esophagitis 07/09/2014  . HLD (hyperlipidemia) 07/09/2014  . Malignant melanoma ([place]) 07/09/2014    Past Surgical History  Procedure Laterality Date  . Hernia repair      x2  . Melanoma excision      Current Outpatient Rx  Name  Route  Sig  Dispense  Refill  . lisinopril-hydrochlorothiazide (PRINZIDE,ZESTORETIC) 20-12.5 MG tablet   Oral   Take 1 tablet by mouth daily.          [name] omeprazole (PRILOSEC) 20 MG capsule   Oral   Take 20 mg by mouth every other day.          . ondansetron (ZOFRAN ODT) 4 MG disintegrating tablet   Oral   Take 1 tablet (4 mg total) by mouth every 8 (eight) hours as needed for nausea or vomiting.   20 tablet   0   . oxyCODONE-acetaminophen (PERCOCET) 7.5-325 MG tablet   Oral   Take 1 tablet by mouth every 4 (four) hours as needed for severe pain.   20 tablet   0   . oxyCODONE-acetaminophen (ROXICET) 5-325 MG tablet   Oral   Take 1 tablet by mouth every 4 (four) hours as needed for severe pain.   30 tablet   0   . simvastatin (ZOCOR) 20 MG tablet   Oral   Take 20 mg by mouth at bedtime.          . sulfamethoxazole-trimethoprim (BACTRIM) 400-80 MG tablet   Oral   Take  1 tablet by mouth 2 (two) times daily.   14 tablet   0   . tamsulosin (FLOMAX) 0.4 MG CAPS capsule   Oral   Take 1 capsule (0.4 mg total) by mouth daily.   30 capsule   12     Allergies Review of patient's allergies indicates no known allergies.  Family History  Problem Relation Age of Onset  . Diabetes Sister   . Kidney disease Sister     Social History Social History  Substance Use Topics  . Smoking status: Never Smoker   . Smokeless tobacco: None  . Alcohol Use: No    Review of Systems  Constitutional: Negative for fatigue. ENT: Negative for congestion. Cardiovascular: Negative for chest pain. Respiratory: Negative for  cough. Gastrointestinal: Negative for abdominal pain, vomiting and diarrhea. Genitourinary: Currently has renal stone with intermittent renal colic. See history of present illness Musculoskeletal: No myalgias or injuries. Skin: Negative for rash. Neurological: Negative for headache or focal weakness   10-point ROS otherwise negative.  ____________________________________________   PHYSICAL EXAM:  VITAL SIGNS: ED Triage Vitals  Enc Vitals Group     BP 09/10/15 1249 144/66 mmHg     Pulse Rate 09/10/15 1249 79     Resp 09/10/15 1249 18     Temp 09/10/15 1249 98.6 F (37 C)     Temp Source 09/10/15 1249 Oral     SpO2 09/10/15 1249 96 %     Weight 09/10/15 1249 177 lb (80.287 kg)     Height 09/10/15 1249 5\' 8"  (1.727 m)     Head Cir --      Peak Flow --      Pain Score 09/10/15 1247 10     Pain Loc --      Pain Edu? --      Excl. in [place]? --     Constitutional: Alert and oriented. Well appearing and in no distress. ENT   Head: Normocephalic and atraumatic.   Nose: No congestion/rhinnorhea.       Mouth: No erythema, no swelling   Cardiovascular: Normal rate, regular rhythm, no murmur noted Respiratory:  Normal respiratory effort, no tachypnea.    Breath sounds are clear and equal bilaterally.  Gastrointestinal: Soft and nontender. No distention.  Back: No muscle spasm, no tenderness, no CVA tenderness. Musculoskeletal: No deformity noted. Nontender with normal range of motion in all extremities.  No noted edema. Neurologic:  Communicative. Normal appearing spontaneous movement in all 4 extremities. No gross focal neurologic deficits are appreciated.  Skin:  Skin is warm, dry. No rash noted. Psychiatric: Mood and affect are normal. Speech and behavior are normal.  ____________________________________________    LABS (pertinent positives/negatives)  Labs from urgent care reviewed with noted decreasing renal function.  ____________________________________________     RADIOLOGY  KUB:  ____________________________________________  ____________________________________________   INITIAL IMPRESSION / ASSESSMENT AND PLAN / ED COURSE  Pertinent labs & imaging results that were available during my care of the patient were reviewed by me and considered in my medical decision making (see chart for details).  Well-appearing 74 year old male in no acute distress. He seems to be suffering intermittently from renal colic. We have discussed dosing of oxycodone for pain control. The patient will likely need to take 2 tablets at a time initially and then possibly wean down to 1 tablet at a time to control his pain.  For the convenience of the patient, we will obtain a KUB and now so he does not need  to return early Monday morning for a film. Pending x-ray, we will write him a prescription for further oxycodone to control his pain.  ----------------------------------------- 2:42 PM on 09/10/2015 -----------------------------------------  KUB x-ray does not see the urinal stone. Radiology hypothesizes that this could be due to the position of the ureter overlying the vertebral bodies.  The patient continues to be comfortable. We will provide him with one Percocet now as I would like for him to continue on a somewhat regular basis with pain control. He has not had great success with pain control over the past few days.  I've asked him to call Dr. Cherrie Gauze office on Monday to inform her that this x-ray has been done and to inquire all about ongoing follow-up. The patient also will follow up with his primary physician regarding his current situation with prostate treatment.  ____________________________________________   FINAL CLINICAL IMPRESSION(S) / ED DIAGNOSES  Final diagnoses:  Renal colic on right side  Renal insufficiency      Ahmed Prima, MD 09/10/15 1446

## 2015-09-10 NOTE — ED Notes (Addendum)
Pt states he had went to [facility] urgent care and had pain medication there with no relief. States urgent care will fax over information. Pt spouse also stated that the Dr was concerned about the pt kidney function.

## 2015-09-10 NOTE — ED Notes (Signed)
Dx with kidney stone last week and states not better

## 2015-09-10 NOTE — Discharge Instructions (Signed)
Urinary Tract Infection Urinary tract infections (UTIs) can develop anywhere along your urinary tract. Your urinary tract is your body's drainage system for removing wastes and extra water. Your urinary tract includes two kidneys, two ureters, a bladder, and a urethra. Your kidneys are a pair of bean-shaped organs. Each kidney is about the size of your fist. They are located below your ribs, one on each side of your spine. CAUSES Infections are caused by microbes, which are microscopic organisms, including fungi, viruses, and bacteria. These organisms are so small that they can only be seen through a microscope. Bacteria are the microbes that most commonly cause UTIs. SYMPTOMS  Symptoms of UTIs may vary by age and gender of the patient and by the location of the infection. Symptoms in young women typically include a frequent and intense urge to urinate and a painful, burning feeling in the bladder or urethra during urination. Older women and men are more likely to be tired, shaky, and weak and have muscle aches and abdominal pain. A fever may mean the infection is in your kidneys. Other symptoms of a kidney infection include pain in your back or sides below the ribs, nausea, and vomiting. DIAGNOSIS To diagnose a UTI, your caregiver will ask you about your symptoms. Your caregiver will also ask you to provide a urine sample. The urine sample will be tested for bacteria and white blood cells. White blood cells are made by your body to help fight infection. TREATMENT  Typically, UTIs can be treated with medication. Because most UTIs are caused by a bacterial infection, they usually can be treated with the use of antibiotics. The choice of antibiotic and length of treatment depend on your symptoms and the type of bacteria causing your infection. HOME CARE INSTRUCTIONS  If you were prescribed antibiotics, take them exactly as your caregiver instructs you. Finish the medication even if you feel better after  you have only taken some of the medication.  Drink enough water and fluids to keep your urine clear or pale yellow.  Avoid caffeine, tea, and carbonated beverages. They tend to irritate your bladder.  Empty your bladder often. Avoid holding urine for long periods of time.  Empty your bladder before and after sexual intercourse.  After a bowel movement, women should cleanse from front to back. Use each tissue only once. SEEK MEDICAL CARE IF:   You have back pain.  You develop a fever.  Your symptoms do not begin to resolve within 3 days. SEEK IMMEDIATE MEDICAL CARE IF:   You have severe back pain or lower abdominal pain.  You develop chills.  You have nausea or vomiting.  You have continued burning or discomfort with urination. MAKE SURE YOU:   Understand these instructions.  Will watch your condition.  Will get help right away if you are not doing well or get worse.   This information is not intended to replace advice given to you by your health care provider. Make sure you discuss any questions you have with your health care provider.   Document Released: 07/25/2005 Document Revised: 07/06/2015 Document Reviewed: 11/23/2011 Elsevier Interactive Patient Education 2016 [place] Guidelines to Help Prevent Kidney Stones Your risk of kidney stones can be decreased by adjusting the foods you eat. The most important thing you can do is drink enough fluid. You should drink enough fluid to keep your urine clear or pale yellow. The following guidelines provide specific information for the type of kidney stone you have had. GUIDELINES  ACCORDING TO TYPE OF KIDNEY STONE Calcium Oxalate Kidney Stones  Reduce the amount of salt you eat. Foods that have a lot of salt cause your body to release excess calcium into your urine. The excess calcium can combine with a substance called oxalate to form kidney stones.  Reduce the amount of animal protein you eat if the amount you  eat is excessive. Animal protein causes your body to release excess calcium into your urine. Ask your dietitian how much protein from animal sources you should be eating.  Avoid foods that are high in oxalates. If you take vitamins, they should have less than 500 mg of vitamin C. Your body turns vitamin C into oxalates. You do not need to avoid fruits and vegetables high in vitamin C. Calcium Phosphate Kidney Stones  Reduce the amount of salt you eat to help prevent the release of excess calcium into your urine.  Reduce the amount of animal protein you eat if the amount you eat is excessive. Animal protein causes your body to release excess calcium into your urine. Ask your dietitian how much protein from animal sources you should be eating.  Get enough calcium from food or take a calcium supplement (ask your dietitian for recommendations). Food sources of calcium that do not increase your risk of kidney stones include:  Broccoli.  Dairy products, such as cheese and yogurt.  Pudding. Uric Acid Kidney Stones  Do not have more than 6 oz of animal protein per day. FOOD SOURCES Animal Protein Sources  Meat (all types).  Poultry.  Eggs.  Fish, seafood. Foods High in [employer] seasonings.  Soy sauce.  Teriyaki sauce.  Cured and processed meats.  Salted crackers and snack foods.  Fast food.  Canned soups and most canned foods. Foods High in Oxalates  Grains:  Amaranth.  Barley.  Grits.  Wheat germ.  Bran.  Buckwheat flour.  All bran cereals.  Pretzels.  Whole wheat bread.  Vegetables:  Beans (wax).  Beets and beet greens.  Collard greens.  Eggplant.  [place].  Leeks.  Okra.  Parsley.  Rutabagas.  Spinach.  Swiss chard.  Tomato paste.  Fried potatoes.  Sweet potatoes.  Fruits:  Red currants.  Figs.  Kiwi.  Rhubarb.  Meat and Other Protein Sources:  Beans (dried).  Soy burgers and other soybean  products.  Miso.  Nuts (peanuts, almonds, pecans, cashews, hazelnuts).  Nut butters.  Sesame seeds and tahini (paste made of sesame seeds).  Poppy seeds.  Beverages:  Chocolate drink mixes.  Soy milk.  Instant iced tea.  Juices made from high-oxalate fruits or vegetables.  Other:  Carob.  Chocolate.  Fruitcake.  Marmalades.   This information is not intended to replace advice given to you by your health care provider. Make sure you discuss any questions you have with your health care provider.   Document Released: 02/09/2011 Document Revised: 10/20/2013 Document Reviewed: 09/11/2013 Elsevier Interactive Patient Education 2016 [employer] Acute Kidney Injury Acute kidney injury is any condition in which there is sudden (acute) damage to the kidneys. Acute kidney injury was previously known as acute kidney failure or acute renal failure. The kidneys are two organs that lie on either side of the spine between the middle of the back and the front of the abdomen. The kidneys:  Remove wastes and extra water from the blood.   Produce important hormones. These help keep bones strong, regulate blood pressure, and help create red blood cells.   Balance the  fluids and chemicals in the blood and tissues. A small amount of kidney damage may not cause problems, but a large amount of damage may make it difficult or impossible for the kidneys to work the way they should. Acute kidney injury may develop into long-lasting (chronic) kidney disease. It may also develop into a life-threatening disease called end-stage kidney disease. Acute kidney injury can get worse very quickly, so it should be treated right away. Early treatment may prevent other kidney diseases from developing. CAUSES   A problem with blood flow to the kidneys. This may be caused by:   Blood loss.   Heart disease.   Severe burns.   Liver disease.  Direct damage to the kidneys. This may be caused  by:  Some medicines.   A kidney infection.   Poisoning or consuming toxic substances.   A surgical wound.   A blow to the kidney area.   A problem with urine flow. This may be caused by:   Cancer.   Kidney stones.   An enlarged prostate. SIGNS AND SYMPTOMS   Swelling (edema) of the legs, ankles, or feet.   Tiredness (lethargy).   Nausea or vomiting.   Confusion.   Problems with urination, such as:   Painful or burning feeling during urination.   Decreased urine production.   Frequent accidents in children who are potty trained.   Bloody urine.   Muscle twitches and cramps.   Shortness of breath.   Seizures.   Chest pain or pressure. Sometimes, no symptoms are present. DIAGNOSIS Acute kidney injury may be detected and diagnosed by tests, including blood, urine, imaging, or kidney biopsy tests.  TREATMENT Treatment of acute kidney injury varies depending on the cause and severity of the kidney damage. In mild cases, no treatment may be needed. The kidneys may heal on their own. If acute kidney injury is more severe, your health care provider will treat the cause of the kidney damage, help the kidneys heal, and prevent complications from occurring. Severe cases may require a procedure to remove toxic wastes from the body (dialysis) or surgery to repair kidney damage. Surgery may involve:   Repair of a torn kidney.   Removal of an obstruction. HOME CARE INSTRUCTIONS  Follow your prescribed diet.  Take medicines only as directed by your health care provider.  Do not take any new medicines (prescription, over-the-counter, or nutritional supplements) unless approved by your health care provider. Many medicines can worsen your kidney damage or may need to have the dose adjusted.   Keep all follow-up visits as directed by your health care provider. This is important.  Observe your condition to make sure you are healing as expected. SEEK  IMMEDIATE MEDICAL CARE IF:  You are feeling ill or have severe pain in the back or side.   Your symptoms return or you have new symptoms.  You have any symptoms of end-stage kidney disease. These include:   Persistent itchiness.   Loss of appetite.   Headaches.   Abnormally dark or light skin.  Numbness in the hands or feet.   Easy bruising.   Frequent hiccups.   Menstruation stops.   You have a fever.  You have increased urine production.  You have pain or bleeding when urinating. MAKE SURE YOU:   Understand these instructions.  Will watch your condition.  Will get help right away if you are not doing well or get worse.   This information is not intended to replace advice given  to you by your health care provider. Make sure you discuss any questions you have with your health care provider.   Document Released: 04/30/2011 Document Revised: 11/05/2014 Document Reviewed: 06/13/2012 Elsevier Interactive Patient Education 2016 [place]. Kidney Stones Kidney stones (urolithiasis) are deposits that form inside your kidneys. The intense pain is caused by the stone moving through the urinary tract. When the stone moves, the ureter goes into spasm around the stone. The stone is usually passed in the urine.  CAUSES   A disorder that makes certain neck glands produce too much parathyroid hormone (primary hyperparathyroidism).  A buildup of uric acid crystals, similar to gout in your joints.  Narrowing (stricture) of the ureter.  A kidney obstruction present at birth (congenital obstruction).  Previous surgery on the kidney or ureters.  Numerous kidney infections. SYMPTOMS   Feeling sick to your stomach (nauseous).  Throwing up (vomiting).  Blood in the urine (hematuria).  Pain that usually spreads (radiates) to the groin.  Frequency or urgency of urination. DIAGNOSIS   Taking a history and physical exam.  Blood or urine tests.  CT  scan.  Occasionally, an examination of the inside of the urinary bladder (cystoscopy) is performed. TREATMENT   Observation.  Increasing your fluid intake.  Extracorporeal shock wave lithotripsy--This is a noninvasive procedure that uses shock waves to break up kidney stones.  Surgery may be needed if you have severe pain or persistent obstruction. There are various surgical procedures. Most of the procedures are performed with the use of small instruments. Only small incisions are needed to accommodate these instruments, so recovery time is minimized. The size, location, and chemical composition are all important variables that will determine the proper choice of action for you. Talk to your health care provider to better understand your situation so that you will minimize the risk of injury to yourself and your kidney.  HOME CARE INSTRUCTIONS   Drink enough water and fluids to keep your urine clear or pale yellow. This will help you to pass the stone or stone fragments.  Strain all urine through the provided strainer. Keep all particulate matter and stones for your health care provider to see. The stone causing the pain may be as small as a grain of salt. It is very important to use the strainer each and every time you pass your urine. The collection of your stone will allow your health care provider to analyze it and verify that a stone has actually passed. The stone analysis will often identify what you can do to reduce the incidence of recurrences.  Only take over-the-counter or prescription medicines for pain, discomfort, or fever as directed by your health care provider.  Keep all follow-up visits as told by your health care provider. This is important.  Get follow-up X-rays if required. The absence of pain does not always mean that the stone has passed. It may have only stopped moving. If the urine remains completely obstructed, it can cause loss of kidney function or even complete  destruction of the kidney. It is your responsibility to make sure X-rays and follow-ups are completed. Ultrasounds of the kidney can show blockages and the status of the kidney. Ultrasounds are not associated with any radiation and can be performed easily in a matter of minutes.  Make changes to your daily diet as told by your health care provider. You may be told to:  Limit the amount of salt that you eat.  Eat 5 or more servings of fruits  and vegetables each day.  Limit the amount of meat, poultry, fish, and eggs that you eat.  Collect a 24-hour urine sample as told by your health care provider.You may need to collect another urine sample every 6-12 months. SEEK MEDICAL CARE IF:  You experience pain that is progressive and unresponsive to any pain medicine you have been prescribed. SEEK IMMEDIATE MEDICAL CARE IF:   Pain cannot be controlled with the prescribed medicine.  You have a fever or shaking chills.  The severity or intensity of pain increases over 18 hours and is not relieved by pain medicine.  You develop a new onset of abdominal pain.  You feel faint or pass out.  You are unable to urinate.   This information is not intended to replace advice given to you by your health care provider. Make sure you discuss any questions you have with your health care provider.   Document Released: 10/15/2005 Document Revised: 07/06/2015 Document Reviewed: 03/18/2013 Elsevier Interactive Patient Education [employer].

## 2015-09-10 NOTE — Discharge Instructions (Signed)
The x-ray did not see the kidney stone today. This may be because the stone has moved overlying a bony area and this hides the stone from x-ray. Follow-up with Dr. Erlene Quan. Please call her office on Monday to inform them that you've R he had an x-ray. Also follow with her primary physician and the doctors helping with your prostate situation.  Take Percocet as needed for pain control. Note that the prescription provided today is a slightly stronger strength than what you have been taking (7.5 mg versus 5 mg). Drink plenty of water. Return the emergency department if you have uncontrolled pain or if you have fever or other urgent concerns.  Kidney Stones Kidney stones (urolithiasis) are deposits that form inside your kidneys. The intense pain is caused by the stone moving through the urinary tract. When the stone moves, the ureter goes into spasm around the stone. The stone is usually passed in the urine.  CAUSES   A disorder that makes certain neck glands produce too much parathyroid hormone (primary hyperparathyroidism).  A buildup of uric acid crystals, similar to gout in your joints.  Narrowing (stricture) of the ureter.  A kidney obstruction present at birth (congenital obstruction).  Previous surgery on the kidney or ureters.  Numerous kidney infections. SYMPTOMS   Feeling sick to your stomach (nauseous).  Throwing up (vomiting).  Blood in the urine (hematuria).  Pain that usually spreads (radiates) to the groin.  Frequency or urgency of urination. DIAGNOSIS   Taking a history and physical exam.  Blood or urine tests.  CT scan.  Occasionally, an examination of the inside of the urinary bladder (cystoscopy) is performed. TREATMENT   Observation.  Increasing your fluid intake.  Extracorporeal shock wave lithotripsy--This is a noninvasive procedure that uses shock waves to break up kidney stones.  Surgery may be needed if you have severe pain or persistent obstruction.  There are various surgical procedures. Most of the procedures are performed with the use of small instruments. Only small incisions are needed to accommodate these instruments, so recovery time is minimized. The size, location, and chemical composition are all important variables that will determine the proper choice of action for you. Talk to your health care provider to better understand your situation so that you will minimize the risk of injury to yourself and your kidney.  HOME CARE INSTRUCTIONS   Drink enough water and fluids to keep your urine clear or pale yellow. This will help you to pass the stone or stone fragments.  Strain all urine through the provided strainer. Keep all particulate matter and stones for your health care provider to see. The stone causing the pain may be as small as a grain of salt. It is very important to use the strainer each and every time you pass your urine. The collection of your stone will allow your health care provider to analyze it and verify that a stone has actually passed. The stone analysis will often identify what you can do to reduce the incidence of recurrences.  Only take over-the-counter or prescription medicines for pain, discomfort, or fever as directed by your health care provider.  Keep all follow-up visits as told by your health care provider. This is important.  Get follow-up X-rays if required. The absence of pain does not always mean that the stone has passed. It may have only stopped moving. If the urine remains completely obstructed, it can cause loss of kidney function or even complete destruction of the kidney. It is  your responsibility to make sure X-rays and follow-ups are completed. Ultrasounds of the kidney can show blockages and the status of the kidney. Ultrasounds are not associated with any radiation and can be performed easily in a matter of minutes.  Make changes to your daily diet as told by your health care provider. You may be  told to:  Limit the amount of salt that you eat.  Eat 5 or more servings of fruits and vegetables each day.  Limit the amount of meat, poultry, fish, and eggs that you eat.  Collect a 24-hour urine sample as told by your health care provider.You may need to collect another urine sample every 6-12 months. SEEK MEDICAL CARE IF:  You experience pain that is progressive and unresponsive to any pain medicine you have been prescribed. SEEK IMMEDIATE MEDICAL CARE IF:   Pain cannot be controlled with the prescribed medicine.  You have a fever or shaking chills.  The severity or intensity of pain increases over 18 hours and is not relieved by pain medicine.  You develop a new onset of abdominal pain.  You feel faint or pass out.  You are unable to urinate.   This information is not intended to replace advice given to you by your health care provider. Make sure you discuss any questions you have with your health care provider.   Document Released: 10/15/2005 Document Revised: 07/06/2015 Document Reviewed: 03/18/2013 Elsevier Interactive Patient Education [employer].

## 2015-09-10 NOTE — ED Provider Notes (Signed)
CSN: [id number]     Arrival date & time 09/10/15  [id number] History   First MD Initiated Contact with Patient 09/10/15 1025     Chief Complaint  Patient presents with  . Flank Pain    Right flank and right front abd pain. Seen in ED on Sunday for same, had recheck on Tues, and scheduled for rescan on Monday. States Oxydodone and Flomax not helping. Pain comes and goes. Currently 5/10   (Consider location/radiation/quality/duration/timing/severity/associated sxs/prior Treatment) HPI Comments: Married caucasian male here for re-evaluation of right flank and abdomen pain percocet 1 tab every 6 hours and motrin prn po not helping.  Urine clear light yellow to beer colored.  On last day of cipro for UTI.  Saw Urology [place] this week repeat labs blood in urine only supposed to follow up Monday for repeat KUB.  Still had stone in ureter and kidney right.  Has been straining urine no stones seen.  Wants IV morphine for pain.  Has only required zofran once for nausea/vomiting since ER visit  Had some leg cramps last night Ate breakfast this am  PMHx prostate cancer awaiting start of radiation treatments, diverticulosis, kidney stones, ventral hernia  The history is provided by the patient and the spouse.    Past Medical History  Diagnosis Date  . Essential (primary) hypertension 07/09/2014  . Gastro-esophageal reflux disease without esophagitis 07/09/2014  . Anxiety 07/09/2014  . HLD (hyperlipidemia) 07/09/2014  . Malignant melanoma ([place] [place]) 07/09/2014  . Prostate cancer ([place]) 06/2015  . Prostate cancer [facility])    Past Surgical History  Procedure Laterality Date  . Hernia repair      x2  . Melanoma excision     Family History  Problem Relation Age of Onset  . Diabetes Sister   . Kidney disease Sister    Social History  Substance Use Topics  . Smoking status: Never Smoker   . Smokeless tobacco: None  . Alcohol Use: No    Review of Systems  Constitutional: Negative for fever, chills, diaphoresis,  activity change, appetite change and fatigue.  HENT: Negative for congestion, dental problem, drooling, ear discharge, ear pain, facial swelling, mouth sores, nosebleeds, postnasal drip, rhinorrhea, sinus pressure, sneezing, sore throat, tinnitus and trouble swallowing.   Eyes: Negative for photophobia, pain, discharge, redness, itching and visual disturbance.  Respiratory: Negative for cough, choking, chest tightness, shortness of breath, wheezing and stridor.   Cardiovascular: Negative for chest pain, palpitations and leg swelling.  Gastrointestinal: Positive for abdominal pain. Negative for nausea, vomiting, diarrhea, constipation, blood in stool and abdominal distention.  Endocrine: Negative for cold intolerance and heat intolerance.  Genitourinary: Positive for flank pain. Negative for dysuria, urgency, frequency, hematuria, decreased urine volume, discharge, penile swelling, scrotal swelling, enuresis, difficulty urinating, genital sores, penile pain and testicular pain.  Musculoskeletal: Positive for back pain. Negative for myalgias, joint swelling, arthralgias, gait problem, neck pain and neck stiffness.  Skin: Negative for color change, pallor, rash and wound.  Allergic/Immunologic: Negative for environmental allergies and food allergies.  Neurological: Negative for dizziness, tremors, seizures, syncope, facial asymmetry, speech difficulty, weakness, light-headedness, numbness and headaches.  Hematological: Negative for adenopathy. Does not bruise/bleed easily.  Psychiatric/Behavioral: Positive for sleep disturbance. Negative for confusion and agitation. The patient is not nervous/anxious.     Allergies  Review of patient's allergies indicates no known allergies.  Home Medications   Prior to Admission medications   Medication Sig Start Date End Date Taking? Authorizing Provider  lisinopril-hydrochlorothiazide (PRINZIDE,ZESTORETIC) 20-12.5 MG tablet  Take 1 tablet by mouth daily.   06/13/15 06/12/16  Historical Provider, MD  omeprazole (PRILOSEC) 20 MG capsule Take 20 mg by mouth every other day.  06/13/15   Historical Provider, MD  ondansetron (ZOFRAN ODT) 4 MG disintegrating tablet Take 1 tablet (4 mg total) by mouth every 8 (eight) hours as needed for nausea or vomiting. 09/04/15   Loney Hering, MD  oxyCODONE-acetaminophen (PERCOCET) 7.5-325 MG tablet Take 1 tablet by mouth every 4 (four) hours as needed for severe pain. 09/10/15   Ahmed Prima, MD  oxyCODONE-acetaminophen (ROXICET) 5-325 MG tablet Take 1 tablet by mouth every 4 (four) hours as needed for severe pain. 09/06/15   Nori Riis, PA-C  simvastatin (ZOCOR) 20 MG tablet Take 20 mg by mouth at bedtime.  06/13/15   Historical Provider, MD  sulfamethoxazole-trimethoprim (BACTRIM) 400-80 MG tablet Take 1 tablet by mouth 2 (two) times daily. 09/10/15 09/16/15  Olen Cordial, NP  tamsulosin (FLOMAX) 0.4 MG CAPS capsule Take 1 capsule (0.4 mg total) by mouth daily. 09/06/15   Nori Riis, PA-C   Meds Ordered and Administered this Visit   Medications  HYDROmorphone (DILAUDID) injection 1 mg (1 mg Intramuscular Given 09/10/15 1155)    BP 138/72 mmHg  Pulse 81  Temp(Src) 97.7 F (36.5 C) (Oral)  Resp 18  Ht 5\' 8"  (1.727 m)  Wt 178 lb (80.74 kg)  BMI 27.07 kg/m2  SpO2 99% No data found.   Physical Exam  Constitutional: He is oriented to person, place, and time. Vital signs are normal. He appears well-developed and well-nourished. He is active and cooperative.  Non-toxic appearance. He does not have a sickly appearance. He does not appear ill. No distress.  HENT:  Head: Normocephalic and atraumatic.  Right Ear: Hearing, external ear and ear canal normal.  Left Ear: Hearing, external ear and ear canal normal.  Nose: Nose normal. No mucosal edema, rhinorrhea, nose lacerations or sinus tenderness. No epistaxis. Right sinus exhibits no maxillary sinus tenderness and no frontal sinus tenderness.  Left sinus exhibits no maxillary sinus tenderness and no frontal sinus tenderness.  Mouth/Throat: Uvula is midline, oropharynx is clear and moist and mucous membranes are normal. Mucous membranes are not pale, not dry and not cyanotic. He does not have dentures. No oral lesions. No trismus in the jaw. Normal dentition. No dental abscesses, uvula swelling, lacerations or dental caries. No oropharyngeal exudate, posterior oropharyngeal edema, posterior oropharyngeal erythema or tonsillar abscesses.  Eyes: Conjunctivae, EOM and lids are normal. Pupils are equal, round, and reactive to light. Right eye exhibits no chemosis, no discharge, no exudate and no hordeolum. No foreign body present in the right eye. Left eye exhibits no chemosis, no discharge, no exudate and no hordeolum. No foreign body present in the left eye. Right conjunctiva is not injected. Right conjunctiva has no hemorrhage. Left conjunctiva is not injected. Left conjunctiva has no hemorrhage. No scleral icterus. Right eye exhibits normal extraocular motion and no nystagmus. Left eye exhibits normal extraocular motion and no nystagmus. Right pupil is round and reactive. Left pupil is round and reactive. Pupils are equal.  Neck: Trachea normal and normal range of motion. Neck supple. No tracheal tenderness, no spinous process tenderness and no muscular tenderness present. No rigidity. No tracheal deviation, no edema, no erythema and normal range of motion present. No thyroid mass and no thyromegaly present.  Cardiovascular: Normal rate, regular rhythm, S1 normal, S2 normal, normal heart sounds and intact distal pulses.  PMI  is not displaced.  Exam reveals no gallop and no friction rub.   No murmur heard. Pulses:      Radial pulses are 2+ on the right side, and 2+ on the left side.  Pulmonary/Chest: Effort normal and breath sounds normal. No accessory muscle usage or stridor. No respiratory distress. He has no decreased breath sounds. He has no  wheezes. He has no rhonchi. He has no rales. He exhibits no tenderness.  Abdominal: Soft. Bowel sounds are normal. He exhibits no shifting dullness, no distension, no pulsatile liver, no fluid wave, no abdominal bruit, no ascites, no pulsatile midline mass and no mass. There is no hepatosplenomegaly. There is tenderness in the right lower quadrant. There is no rigidity, no rebound, no guarding, no CVA tenderness, no tenderness at McBurney's point and negative Murphy's sign. A hernia is present. Hernia confirmed positive in the ventral area.    tympanny to percussion RUQ/LUQ; dull to percussion RLQ/LLQ; normoactive bowel sounds x 4 quads  Musculoskeletal: Normal range of motion. He exhibits no edema or tenderness.       Right shoulder: Normal.       Left shoulder: Normal.       Right elbow: Normal.      Left elbow: Normal.       Right hip: Normal.       Left hip: Normal.       Right knee: Normal.       Left knee: Normal.       Right ankle: Normal.       Left ankle: Normal.       Cervical back: Normal.       Thoracic back: He exhibits pain. He exhibits normal range of motion, no tenderness, no bony tenderness, no swelling, no edema, no deformity, no laceration, no spasm and normal pulse.       Lumbar back: He exhibits pain. He exhibits normal range of motion, no tenderness, no bony tenderness, no swelling, no edema, no deformity, no laceration, no spasm and normal pulse.       Right hand: Normal.       Left hand: Normal.  Lymphadenopathy:    He has no cervical adenopathy.  Neurological: He is alert and oriented to person, place, and time. He displays no atrophy and no tremor. No cranial nerve deficit or sensory deficit. He exhibits normal muscle tone. He displays no seizure activity. Coordination and gait normal. GCS eye subscore is 4. GCS verbal subscore is 5. GCS motor subscore is 6.  Skin: Skin is warm, dry and intact. No abrasion, no bruising, no burn, no ecchymosis, no laceration, no  lesion, no petechiae and no rash noted. He is not diaphoretic. No cyanosis or erythema. No pallor. Nails show no clubbing.  Psychiatric: He has a normal mood and affect. His speech is normal and behavior is normal. Judgment and thought content normal. Cognition and memory are normal.  Nursing note and vitals reviewed.   ED Course  Procedures (including critical care time)  Labs Review Labs Reviewed  URINALYSIS COMPLETEWITH MICROSCOPIC ([facility] ONLY) - Abnormal; Notable for the following:    Leukocytes, UA TRACE (*)    Squamous Epithelial / LPF 0-5 (*)    All other components within normal limits  CBC WITH DIFFERENTIAL/PLATELET - Abnormal; Notable for the following:    Neutro Abs 6.9 (*)    Lymphs Abs 0.9 (*)    All other components within normal limits  COMPREHENSIVE METABOLIC PANEL - Abnormal; Notable for the  following:    Sodium 129 (*)    Chloride 93 (*)    Glucose, Bld 122 (*)    BUN 29 (*)    Creatinine, Ser 2.07 (*)    Total Protein 8.2 (*)    AST 6 (*)    GFR calc non Af Amer 30 (*)    GFR calc Af Amer 34 (*)    All other components within normal limits    Imaging Review Dg Abd 1 View  09/10/2015  CLINICAL DATA:  Known right side kidney stone X 6 days, still has pain EXAM: ABDOMEN - 1 VIEW COMPARISON:  CT of the abdomen and pelvis on 09/04/2015 FINDINGS: Bowel gas pattern is nonobstructive. A rounded calcified gallstone is identified in the right upper quadrant. 4 mm stone previously noted in the right proximal ureter at the level of L3-4 is not well seen on today's plain film. However the location of the ureter at this level likely superimposes over the lumbar spine and a stone may be obscured. Calcifications are identified in the within the pelvis, likely vascular based on compares with CT. There is moderate stool burden in nondilated loops of colon. Degenerative changes are seen in the lower lumbar spine. IMPRESSION: 1. Non obstructive bowel gas pattern. 2. Known right  ureteral stone is not seen radiographically, likely secondary to the position of the ureter overlying the vertebral bodies. Electronically Signed   By: Nolon Nations M.D.   On: 09/10/2015 14:19    CLINICAL DATA: Severe low right lower back pain. Vomiting.  EXAM: CT ABDOMEN AND PELVIS WITHOUT CONTRAST  TECHNIQUE: Multidetector CT imaging of the abdomen and pelvis was performed following the standard protocol without IV contrast.  COMPARISON: 12/23/2013  FINDINGS: There is a 5 mm proximal right ureteral calculus at the low L3 level with moderate hydronephrosis. Additional 2-3 mm collecting system calculi are present elsewhere in both kidneys. Multiple cysts are present in the right kidney, not conclusively characterized in the absence of intravenous contrast.  There is a gallbladder calculus. There are unremarkable unenhanced appearances of the liver and bile ducts. The spleen, pancreas and adrenals appear unremarkable. 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 There is no evidence of aneurysm rupture or retroperitoneal hemorrhage.  There is a large hiatal hernia. The appendix is normal. There is uncomplicated colonic diverticulosis.  No significant skeletal lesion. No significant abnormality in the lower chest except for the hiatal hernia.  IMPRESSION: 1. 5 mm proximal right ureteral calculus with moderate hydronephrosis. Bilateral nephrolithiasis. 2. Cholelithiasis. 3. Diverticulosis. 4. 3 cm abdominal aortic aneurysm. 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 Not significantly enlarged from 12/23/2013. No aneurysm rupture or retroperitoneal hemorrhage. 5. Large hiatal hernia.   Electronically Signed  By: Andreas Newport M.D.  On: 09/04/2015 06:24  1030 discussed urinalysis results with patient and spouse given copy of report rare bacteria, blood, trace leukocytes failed ciprofloxin.   Will start new antibiotic TBD once kidney function known.  Consider KUB/CT scan.   Patient and spouse verbalized understanding of information/instructions, agreed with plan of care and had no further questions at this time.  1130 Discussed CBC and CMP results with patient and spouse and given copy of reports.  To follow up with urology Monday as already scheduled.  Discussed with patient GFR decreased to 30 previously 52 earlier this week and Cr 2.07 previously  Start bactrim po BID (1/2 strength due to renal function).  Continue flomax.  Has percocet at home.  Recommended transport to ED for IV hydration, possible repeat CT, urology evaluation.  Hydrate, hydrate, hydrate given cup of water.  Pain may decrease with fluid hydration but if not may require surgical intervention if stone obstructing ureter/kidney/hydronephrosis present.  Patient and spouse verbalized understanding of information/instructions, agreed with plan of care and had no further questions at this time.  1140 Report called to [facility] ED Charge Nurse Cherly Beach.  Patient transport via POV with spouse after observation period post dilaudid administration 1mg  IM (decreased dosing due to renal function).  Patient refused to leave until he had received pain medication.  Discussed with patient he had taken oxycodone this am at home.  Due to decreased renal function I was concerned about harming his kidneys further and that most of the pain medications he has Rx for at home recommend decreased dosage to prevent further damage and I would not be increasing dosage or writing further discharge Rx when he has pain medications at home.  Patient requested IV morphine.  Discussed with patient and spouse we do not carry that medication in our clinic.  Discussed with patient his pain may also improve with hydration.  Case discussed with Dr Alveta Heimlich and agreed to try dilaudid IM for patient pain control in low dose.  Patient and spouse agreed with plan of care,  verbalized understanding of information and had no further questions at this time.  1155 Dilaudid 1mg  IM given to patient by RN Lalla Brothers  Filed Vitals:   09/10/15 1216  BP: 138/72  Pulse: 81  Temp:   Resp:   Patient still having pain but agreed to POV transport to ER by spouse for further evaluation and treatment.    MDM   1. Acute renal insufficiency   2. Right ureteral stone   3. Microscopic hematuria   4. UTI (lower urinary tract infection)   5. Prostate cancer ([place])    Bactrim po bid x 7 days Rx given.  Follow up with urology as scheduled Monday 14 Nov.  POV to [facility] ER patient preferred for further treatment acute renal insufficiency today.  Strain all urine.  Hydrate, hydrate.   Flomax 0.4mg  daily as directed has at home.  zofran 4mg  ODT po TID prn nausea/vomiting patient has at home.  Oxycodone 5mg  po q6h at home and refill Rx in patient possession from earlier this week. Patient is also to push fluids and strain urine. Call or return to clinic as needed if these symptoms worsen or fail to improve as anticipated e.g. gross hematuria, fever, worsening pain, unable to void at least every 8 hours, tea colored urine.  Exitcare handout on nephrolithiasis, acute renal insufficiency and UTI given to patient. Spouse and  Patient verbalized agreement and understanding of treatment plan and had no further questions at this time. P2:  Hydrate, post coital urination, and cranberry juice    Olen Cordial, NP 09/11/15 [phone]

## 2015-09-12 ENCOUNTER — Ambulatory Visit (INDEPENDENT_AMBULATORY_CARE_PROVIDER_SITE_OTHER): Payer: Commercial Managed Care - PPO | Admitting: Urology

## 2015-09-12 ENCOUNTER — Encounter: Payer: Self-pay | Admitting: Urology

## 2015-09-12 ENCOUNTER — Ambulatory Visit
Admission: RE | Admit: 2015-09-12 | Discharge: 2015-09-12 | Disposition: A | Discharge status: Home / Self Care | Payer: Commercial Managed Care - PPO | Source: Ambulatory Visit | Attending: Urology | Admitting: Urology

## 2015-09-12 ENCOUNTER — Telehealth: Payer: Self-pay | Admitting: Urology

## 2015-09-12 VITALS — BP 102/67 | HR 102 | Ht 68.0 in | Wt 181.4 lb

## 2015-09-12 DIAGNOSIS — C61 Malignant neoplasm of prostate: Secondary | ICD-10-CM | POA: Diagnosis not present

## 2015-09-12 DIAGNOSIS — R1011 Right upper quadrant pain: Secondary | ICD-10-CM | POA: Diagnosis not present

## 2015-09-12 DIAGNOSIS — N201 Calculus of ureter: Secondary | ICD-10-CM | POA: Diagnosis not present

## 2015-09-12 DIAGNOSIS — R109 Unspecified abdominal pain: Secondary | ICD-10-CM

## 2015-09-12 DIAGNOSIS — N281 Cyst of kidney, acquired: Secondary | ICD-10-CM | POA: Diagnosis not present

## 2015-09-12 DIAGNOSIS — N132 Hydronephrosis with renal and ureteral calculous obstruction: Secondary | ICD-10-CM | POA: Diagnosis not present

## 2015-09-12 LAB — URINALYSIS, COMPLETE
Bilirubin, UA: NEGATIVE
Glucose, UA: NEGATIVE
Ketones, UA: NEGATIVE
LEUKOCYTES UA: NEGATIVE
Nitrite, UA: NEGATIVE
PH UA: 6.5 (ref 5.0–7.5)
PROTEIN UA: NEGATIVE
Specific Gravity, UA: 1.02 (ref 1.005–1.030)
UUROB: 0.2 mg/dL (ref 0.2–1.0)

## 2015-09-12 LAB — MICROSCOPIC EXAMINATION
BACTERIA UA: NONE SEEN
WBC UA: NONE SEEN /HPF (ref 0–?)

## 2015-09-12 MED ORDER — OXYCODONE-ACETAMINOPHEN 10-325 MG PO TABS: 1.0000 | ORAL_TABLET | ORAL | Status: DC | PRN

## 2015-09-12 NOTE — Telephone Encounter (Signed)
Would you call the patient and tell him that the kidney is still swollen and the stone was seen on the ultrasound?  Amy will be calling him to schedule surgery.  His creatinine is elevated, but I would like him to increase his fluid intake and have it repeated in two days.

## 2015-09-12 NOTE — Progress Notes (Signed)
09/12/2015 4:51 PM   Raymond Christian 11-17-1940 [id number]  Referring provider: Sherrin Daisy, MD [place], [place] [id number]  Chief Complaint  Patient presents with  . Nephrolithiasis    HPI: Patient is a 74 year old white male with a known 5 mm proximal right ureteral calculus with moderate hydronephrosis confirmed by a CT Renal Stone Study completed on 09/04/2015 who was seen over the weekend at [facility] ED for intense right sided flank pain.    He was having pain and nausea that he could not control.  A KUB taken in the ED did not identify a distinct stone.  He has not passed a fragment.  He is not having gross hematuria.  He is taking two oxycodone/APAP 5/325 tablets at a time and they are not providing relief.  He is also experiencing nausea and vomiting.    The patient did eat a [place] sandwich this morning, so a planned intervention cannot be performed at this time.  His UA did contain 3-10 RBC's/hpf at today's visit.  His creatinine was elevated in the ED to 2.07 with an elevated BUN.    He is scheduled for gold seed placement tomorrow, but this will be post-poned.    PMH: Past Medical History  Diagnosis Date  . Essential (primary) hypertension 07/09/2014  . Gastro-esophageal reflux disease without esophagitis 07/09/2014  . Anxiety 07/09/2014  . HLD (hyperlipidemia) 07/09/2014  . Malignant melanoma ([place]) 07/09/2014  . Prostate cancer ([place]) 06/2015  . Prostate cancer [facility])     Surgical History: Past Surgical History  Procedure Laterality Date  . Hernia repair      x2  . Melanoma excision      Home Medications:    Medication List       This list is accurate as of: 09/12/15  4:51 PM.  Always use your most recent med list.               lisinopril-hydrochlorothiazide 20-12.5 MG tablet  Commonly known as:  PRINZIDE,ZESTORETIC  Take 1 tablet by mouth daily.     omeprazole 20 MG capsule  Commonly known as:  PRILOSEC  Take 20 mg by mouth every  other day.     ondansetron 4 MG disintegrating tablet  Commonly known as:  ZOFRAN ODT  Take 1 tablet (4 mg total) by mouth every 8 (eight) hours as needed for nausea or vomiting.     oxyCODONE-acetaminophen 10-325 MG tablet  Commonly known as:  PERCOCET  Take 1 tablet by mouth every 4 (four) hours as needed for pain.     simvastatin 20 MG tablet  Commonly known as:  ZOCOR  Take 20 mg by mouth at bedtime.     sulfamethoxazole-trimethoprim 400-80 MG tablet  Commonly known as:  BACTRIM  Take 1 tablet by mouth 2 (two) times daily.     tamsulosin 0.4 MG Caps capsule  Commonly known as:  FLOMAX  Take 1 capsule (0.4 mg total) by mouth daily.        Allergies: No Known Allergies  Family History: Family History  Problem Relation Age of Onset  . Diabetes Sister   . Kidney disease Sister     Social History:  reports that he has never smoked. He does not have any smokeless tobacco history on file. He reports that he does not drink alcohol or use illicit drugs.  ROS: UROLOGY Frequent Urination?: No Hard to postpone urination?: No Burning/pain with urination?: No Get up at night to  urinate?: No Leakage of urine?: No Urine stream starts and stops?: No Trouble starting stream?: No Do you have to strain to urinate?: No Blood in urine?: No Urinary tract infection?: No Sexually transmitted disease?: No Injury to kidneys or bladder?: No Painful intercourse?: No Weak stream?: No Erection problems?: No Penile pain?: No  Gastrointestinal Nausea?: Yes Vomiting?: No Indigestion/heartburn?: Yes Diarrhea?: No Constipation?: Yes  Constitutional Fever: No Night sweats?: No Weight loss?: No Fatigue?: No  Skin Skin rash/lesions?: No Itching?: No  Eyes Blurred vision?: No Double vision?: No  Ears/Nose/Throat Sore throat?: No Sinus problems?: No  Hematologic/Lymphatic Swollen glands?: No Easy bruising?: Yes  Cardiovascular Leg swelling?: No Chest pain?:  No  Respiratory Cough?: No Shortness of breath?: No  Endocrine Excessive thirst?: No  Musculoskeletal Back pain?: No Joint pain?: No  Neurological Headaches?: No Dizziness?: Yes  Psychologic Depression?: No Anxiety?: No  Physical Exam: BP 102/67 mmHg  Pulse 102  Ht 5\' 8"  (1.727 m)  Wt 181 lb 6.4 oz (82.283 kg)  BMI 27.59 kg/m2  Constitutional: Well nourished. Alert and oriented, No acute distress. HEENT: [place] AT, moist mucus membranes. Trachea midline, no masses. Cardiovascular: No clubbing, cyanosis, or edema. Respiratory: Normal respiratory effort, no increased work of breathing. GI: Abdomen is soft, non tender, non distended, no abdominal masses. Liver and spleen not palpable.  No hernias appreciated.  Stool sample for occult testing is not indicated.   GU: Right CVA tenderness.  No bladder fullness or masses.   Skin: No rashes, bruises or suspicious lesions. Lymph: No cervical or inguinal adenopathy. Neurologic: Grossly intact, no focal deficits, moving all 4 extremities. Psychiatric: Normal mood and affect.  Laboratory Data: Lab Results  Component Value Date   WBC 8.7 09/10/2015   HGB 15.4 09/10/2015   HCT 44.0 09/10/2015   MCV 93.3 09/10/2015   PLT 203 09/10/2015    Lab Results  Component Value Date   CREATININE 2.07* 09/10/2015    Lab Results  Component Value Date   PSA 4.3* 08/01/2015   PSA 2.8 08/06/2012   PSA 4.4* 01/23/2012    No results found for: TESTOSTERONE  Lab Results  Component Value Date   HGBA1C 6.0 12/24/2013    Urinalysis Results for orders placed or performed in visit on 09/12/15  Microscopic Examination  Result Value Ref Range   WBC, UA None seen 0 -  5 /hpf   RBC, UA 3-10 (A) 0 -  2 /hpf   Epithelial Cells (non renal) 0-10 0 - 10 /hpf   Mucus, UA Present (A) Not Estab.   Bacteria, UA None seen None seen/Few  Urinalysis, Complete  Result Value Ref Range   Specific Gravity, UA 1.020 1.005 - 1.030   pH, UA 6.5 5.0 -  7.5   Color, UA Yellow Yellow   Appearance Ur Clear Clear   Leukocytes, UA Negative Negative   Protein, UA Negative Negative/Trace   Glucose, UA Negative Negative   Ketones, UA Negative Negative   RBC, UA 3+ (A) Negative   Bilirubin, UA Negative Negative   Urobilinogen, Ur 0.2 0.2 - 1.0 mg/dL   Nitrite, UA Negative Negative   Microscopic Examination See below:     Pertinent Imaging: CLINICAL DATA: Severe low right lower back pain. Vomiting.  EXAM: CT ABDOMEN AND PELVIS WITHOUT CONTRAST  TECHNIQUE: Multidetector CT imaging of the abdomen and pelvis was performed following the standard protocol without IV contrast.  COMPARISON: 12/23/2013  FINDINGS: There is a 5 mm proximal right ureteral calculus at  the low L3 level with moderate hydronephrosis. Additional 2-3 mm collecting system calculi are present elsewhere in both kidneys. Multiple cysts are present in the right kidney, not conclusively characterized in the absence of intravenous contrast.  There is a gallbladder calculus. There are unremarkable unenhanced appearances of the liver and bile ducts. The spleen, pancreas and adrenals appear unremarkable. 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 There is no evidence of aneurysm rupture or retroperitoneal hemorrhage.  There is a large hiatal hernia. The appendix is normal. There is uncomplicated colonic diverticulosis.  No significant skeletal lesion. No significant abnormality in the lower chest except for the hiatal hernia.  IMPRESSION: 1. 5 mm proximal right ureteral calculus with moderate hydronephrosis. Bilateral nephrolithiasis. 2. Cholelithiasis. 3. Diverticulosis. 4. 3 cm abdominal aortic aneurysm. 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 Not significantly enlarged from 12/23/2013. No aneurysm rupture or retroperitoneal hemorrhage. 5. Large hiatal hernia.   Electronically  Signed  By: Andreas Newport M.D.  On: 09/04/2015 06:24  CLINICAL DATA: Known right side kidney stone X 6 days, still has pain  EXAM: ABDOMEN - 1 VIEW  COMPARISON: CT of the abdomen and pelvis on 09/04/2015  FINDINGS: Bowel gas pattern is nonobstructive. A rounded calcified gallstone is identified in the right upper quadrant. 4 mm stone previously noted in the right proximal ureter at the level of L3-4 is not well seen on today's plain film. However the location of the ureter at this level likely superimposes over the lumbar spine and a stone may be obscured. Calcifications are identified in the within the pelvis, likely vascular based on compares with CT. There is moderate stool burden in nondilated loops of colon. Degenerative changes are seen in the lower lumbar spine.  IMPRESSION: 1. Non obstructive bowel gas pattern. 2. Known right ureteral stone is not seen radiographically, likely secondary to the position of the ureter overlying the vertebral bodies.   Electronically Signed  By: Nolon Nations M.D.  On: 09/10/2015 14:19  CLINICAL DATA: Right ureteral stone.  EXAM: RENAL / URINARY TRACT ULTRASOUND COMPLETE  COMPARISON: CT scans dated 09/04/2015 and 12/23/2013  FINDINGS: Right Kidney:  Length: 12.1 cm. There is persistent hydronephrosis. Multiple renal cysts, the largest being 4 cm on the upper pole. 6 mm stone in the proximal right ureter as noted on the prior CT scan.  Left Kidney:  Length: 10.9 cm. Multiple peripelvic cysts as demonstrated on prior CT scan. Echogenicity within normal limits. No mass or hydronephrosis visualized. 4.3 cm cyst in the mid left kidney.  Bladder:  Appears normal for degree of bladder distention. Left ureteral jet is identified.  IMPRESSION: Persistent hydronephrosis of the right kidney with a visible 6 mm stone in the proximal right ureter. Bilateral renal cysts as well as multiple peripelvic  cysts on the left.   Electronically Signed  By: Lorriane Shire M.D.  On: 09/12/2015 15:31  Assessment & Plan:   1. Right ureteral stone:   Patient has a known 5 mm proximal right ureteral calculus confirmed by CT scan completed 09/04/2015.  Patient continues to have intractable right-sided flank pain with nausea and vomiting.  He was seen in the emergency room over the weekend and KUB taken at that time did not demonstrate obvious calculi.  I will obtain a renal ultrasound today to evaluate for hydronephrosis. If hydronephrosis is present, I will schedule him for right URS/LL/right ureteral stent placement.  - Urinalysis, Complete - oxyCODONE-acetaminophen (  PERCOCET) 10-325 MG tablet; Take 1 tablet by mouth every 4 (four) hours as needed for pain.  Dispense: 30 tablet; Refill: 0 - US Renal; Future - CULTURE, URINE COMPREHENSIVE  2. Right flank pain:   Patient is having intense right-sided flank pain associated with nausea.  Patient will have a renal ultrasound this afternoon. If he is unable to get his pain or nausea under control, I advised him to seek treatment in the emergency room. I also advised him to try to not eat if he is unable to get his pain or nausea under control if an emergent stent placement is required.  He is also to seek treatment to the emergency room if he should develop fevers or chills.  I will contact the patient with his RUS results.    - oxyCODONE-acetaminophen (PERCOCET) 10-325 MG tablet; Take 1 tablet by mouth every 4 (four) hours as needed for pain.  Dispense: 30 tablet; Refill: 0  3. 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  The placement was scheduled for this week, but it will be post poned until the stone issue is resolved.    Addendum:  RUS demonstrates the persistent of the right hydronephrosis with the 6 mm proximal stone.  I have reviewed  the images.  Per Dr. Guinevere Ferrari instructions, the patient is instructed to increase his fluids and to report to the ED if he should develop fevers, chills and/or intractable vomiting or pain.  He was asking the [place] tech if he could have something to eat when he gets home tonight, so I believe the pain has subsided for now.    We will schedule him for the right URS/LL/right ureteral stent placement.  Patient will be coming into the office Wednesday morning to discuss the procedure and to have a repeated serum creatinine after increasing his fluids.  His rise in creatinine may be due to dehydration.     Return for RTC Wednesday for recheck on creatinine.  Zara Council, [facility] [address] [place], [place] [address] [phone]

## 2015-09-13 ENCOUNTER — Observation Stay (HOSPITAL_COMMUNITY)
Admission: EM | Admit: 2015-09-13 | Discharge: 2015-09-14 | Disposition: A | Discharge status: Home / Self Care | Payer: Commercial Managed Care - PPO | Attending: Urology | Admitting: Urology

## 2015-09-13 ENCOUNTER — Emergency Department (HOSPITAL_COMMUNITY): Payer: Commercial Managed Care - PPO

## 2015-09-13 ENCOUNTER — Encounter (HOSPITAL_COMMUNITY): Payer: Self-pay | Admitting: Emergency Medicine

## 2015-09-13 ENCOUNTER — Other Ambulatory Visit: Payer: Medicare Other | Admitting: Urology

## 2015-09-13 DIAGNOSIS — N201 Calculus of ureter: Secondary | ICD-10-CM

## 2015-09-13 DIAGNOSIS — Z85820 Personal history of malignant melanoma of skin: Secondary | ICD-10-CM | POA: Insufficient documentation

## 2015-09-13 DIAGNOSIS — N281 Cyst of kidney, acquired: Secondary | ICD-10-CM | POA: Insufficient documentation

## 2015-09-13 DIAGNOSIS — I714 Abdominal aortic aneurysm, without rupture: Secondary | ICD-10-CM | POA: Diagnosis not present

## 2015-09-13 DIAGNOSIS — Z8546 Personal history of malignant neoplasm of prostate: Secondary | ICD-10-CM | POA: Insufficient documentation

## 2015-09-13 DIAGNOSIS — Z79899 Other long term (current) drug therapy: Secondary | ICD-10-CM | POA: Diagnosis not present

## 2015-09-13 DIAGNOSIS — K219 Gastro-esophageal reflux disease without esophagitis: Secondary | ICD-10-CM | POA: Diagnosis not present

## 2015-09-13 DIAGNOSIS — N132 Hydronephrosis with renal and ureteral calculous obstruction: Principal | ICD-10-CM | POA: Insufficient documentation

## 2015-09-13 DIAGNOSIS — I10 Essential (primary) hypertension: Secondary | ICD-10-CM | POA: Insufficient documentation

## 2015-09-13 DIAGNOSIS — E785 Hyperlipidemia, unspecified: Secondary | ICD-10-CM | POA: Diagnosis not present

## 2015-09-13 DIAGNOSIS — N19 Unspecified kidney failure: Secondary | ICD-10-CM

## 2015-09-13 DIAGNOSIS — R1031 Right lower quadrant pain: Secondary | ICD-10-CM | POA: Diagnosis present

## 2015-09-13 LAB — COMPREHENSIVE METABOLIC PANEL
ALT: 20 U/L (ref 17–63)
AST: 25 U/L (ref 15–41)
Albumin: 3.9 g/dL (ref 3.5–5.0)
Alkaline Phosphatase: 43 U/L (ref 38–126)
Anion gap: 13 (ref 5–15)
BUN: 38 mg/dL — ABNORMAL HIGH (ref 6–20)
CHLORIDE: 95 mmol/L — AB (ref 101–111)
CO2: 23 mmol/L (ref 22–32)
Calcium: 9 mg/dL (ref 8.9–10.3)
Creatinine, Ser: 2.88 mg/dL — ABNORMAL HIGH (ref 0.61–1.24)
GFR, EST AFRICAN AMERICAN: 23 mL/min — AB (ref >60)
GFR, EST NON AFRICAN AMERICAN: 20 mL/min — AB (ref >60)
Glucose, Bld: 139 mg/dL — ABNORMAL HIGH (ref 65–99)
POTASSIUM: 5 mmol/L (ref 3.5–5.1)
SODIUM: 131 mmol/L — AB (ref 135–145)
Total Bilirubin: 0.7 mg/dL (ref 0.3–1.2)
Total Protein: 6.8 g/dL (ref 6.5–8.1)

## 2015-09-13 LAB — CBC
HEMATOCRIT: 40.3 % (ref 39.0–52.0)
HEMOGLOBIN: 14 g/dL (ref 13.0–17.0)
MCH: 32.6 pg (ref 26.0–34.0)
MCHC: 34.7 g/dL (ref 30.0–36.0)
MCV: 93.7 fL (ref 78.0–100.0)
Platelets: 205 10*3/uL (ref 150–400)
RBC: 4.3 MIL/uL (ref 4.22–5.81)
RDW: 13 % (ref 11.5–15.5)
WBC: 8.2 10*3/uL (ref 4.0–10.5)

## 2015-09-13 LAB — URINALYSIS, ROUTINE W REFLEX MICROSCOPIC
Bilirubin Urine: NEGATIVE
GLUCOSE, UA: NEGATIVE mg/dL
KETONES UR: 15 mg/dL — AB
LEUKOCYTES UA: NEGATIVE
Nitrite: NEGATIVE
PH: 5 (ref 5.0–8.0)
PROTEIN: NEGATIVE mg/dL
SPECIFIC GRAVITY, URINE: 1.024 (ref 1.005–1.030)

## 2015-09-13 LAB — URINE MICROSCOPIC-ADD ON
Bacteria, UA: NONE SEEN
SQUAMOUS EPITHELIAL / LPF: NONE SEEN

## 2015-09-13 MED ORDER — KETOROLAC TROMETHAMINE 30 MG/ML IJ SOLN
30.0000 mg | Freq: Once | INTRAMUSCULAR | Status: AC
  Administered 2015-09-13: 30 mg via INTRAVENOUS
  Filled 2015-09-13: qty 1

## 2015-09-13 MED ORDER — FENTANYL CITRATE (PF) 100 MCG/2ML IJ SOLN: 50.0000 ug | Freq: Once | INTRAMUSCULAR | Status: DC

## 2015-09-13 MED ORDER — SENNOSIDES-DOCUSATE SODIUM 8.6-50 MG PO TABS
1.0000 | ORAL_TABLET | Freq: Two times a day (BID) | ORAL | Status: DC
  Administered 2015-09-14: 1 via ORAL
  Filled 2015-09-13: qty 1

## 2015-09-13 MED ORDER — OXYCODONE HCL 5 MG PO TABS: 5.0000 mg | ORAL_TABLET | ORAL | Status: DC | PRN

## 2015-09-13 MED ORDER — HYDROMORPHONE HCL 1 MG/ML IJ SOLN: 0.5000 mg | INTRAMUSCULAR | Status: DC | PRN

## 2015-09-13 MED ORDER — SODIUM CHLORIDE 0.9 % IV BOLUS (SEPSIS)
500.0000 mL | Freq: Once | INTRAVENOUS | Status: AC
  Administered 2015-09-13: 500 mL via INTRAVENOUS

## 2015-09-13 MED ORDER — ACETAMINOPHEN 500 MG PO TABS: 1000.0000 mg | ORAL_TABLET | Freq: Three times a day (TID) | ORAL | Status: DC

## 2015-09-13 MED ORDER — DEXTROSE 5 % IV SOLN
1.0000 g | Freq: Once | INTRAVENOUS | Status: AC
  Administered 2015-09-13: 1 g via INTRAVENOUS
  Filled 2015-09-13: qty 10

## 2015-09-13 MED ORDER — ONDANSETRON HCL 4 MG/2ML IJ SOLN: 4.0000 mg | Freq: Once | INTRAMUSCULAR | Status: DC

## 2015-09-13 MED ORDER — SODIUM CHLORIDE 0.9 % IV SOLN
INTRAVENOUS | Status: DC
  Administered 2015-09-13: via INTRAVENOUS

## 2015-09-13 NOTE — ED Notes (Signed)
Pt sts severe right flank pain; pt being seen by urologist and working on being scheduled for stent placement but is scheduled for 09/26/15; pt family sts when taking increased amount of pain medication pt is not tolerating that and having nausea and hallucinations; pt here requesting different pain meds to control pain that dont have the side effects; pt also has hx of prostate CA

## 2015-09-13 NOTE — ED Provider Notes (Addendum)
CSN: [id number]     Arrival date & time 09/13/15  1628 History      Chief Complaint  Patient presents with  . Nephrolithiasis  . Flank Pain      HPI.....[name] level 5 caveat for urgent need for intervention. Persistent right flank and right lower quadrant abdominal pain for 10 days.   S/p CT scan on 09/04/15 revealing a 5 mm proximal right ureteral stone with hydronephrosis. Patient was evaluated by [place]. A stent was scheduled for 09/26/15. However, the patient feels his pain is unbearable and he cannot wait that long No dysuria, hematuria, fever, chills. .  Past Medical History  Diagnosis Date  . Essential (primary) hypertension 07/09/2014  . Gastro-esophageal reflux disease without esophagitis 07/09/2014  . Anxiety 07/09/2014  . HLD (hyperlipidemia) 07/09/2014  . Malignant melanoma ([place]) 07/09/2014  . Prostate cancer ([place]) 06/2015  . Prostate cancer [facility])    Past Surgical History  Procedure Laterality Date  . Hernia repair      x2  . Melanoma excision     Family History  Problem Relation Age of Onset  . Diabetes Sister   . Kidney disease Sister    Social History  Substance Use Topics  . Smoking status: Never Smoker   . Smokeless tobacco: None  . Alcohol Use: No    Review of Systems  All other systems reviewed and are negative.     Allergies  Review of patient's allergies indicates no known allergies.  Home Medications   Prior to Admission medications   Medication Sig Start Date End Date Taking? Authorizing Provider  ciprofloxacin (CIPRO) 500 MG tablet Take 500 mg by mouth 2 (two) times daily. Started on 09-04-15 for 10 days   Yes Historical Provider, MD  lisinopril-hydrochlorothiazide (PRINZIDE,ZESTORETIC) 20-12.5 MG tablet Take 1 tablet by mouth daily.  06/13/15 06/12/16 Yes Historical Provider, MD  omeprazole (PRILOSEC) 20 MG capsule Take 20 mg by mouth every other day.  06/13/15  Yes Historical Provider, MD  ondansetron (ZOFRAN ODT) 4 MG  disintegrating tablet Take 1 tablet (4 mg total) by mouth every 8 (eight) hours as needed for nausea or vomiting. 09/04/15  Yes Loney Hering, MD  simvastatin (ZOCOR) 20 MG tablet Take 20 mg by mouth at bedtime.  06/13/15  Yes Historical Provider, MD  sulfamethoxazole-trimethoprim (BACTRIM) 400-80 MG tablet Take 1 tablet by mouth 2 (two) times daily. 09/10/15 09/16/15 Yes Olen Cordial, NP  tamsulosin (FLOMAX) 0.4 MG CAPS capsule Take 1 capsule (0.4 mg total) by mouth daily. 09/06/15  Yes Shannon A McGowan, PA-C   BP 129/66 mmHg  Pulse 66  Temp(Src) 98.3 F (36.8 C) (Oral)  Resp 17  Wt 181 lb (82.101 kg)  SpO2 94% Physical Exam  Constitutional: He is oriented to person, place, and time. He appears well-developed and well-nourished.  HENT:  Head: Normocephalic and atraumatic.  Eyes: Conjunctivae and EOM are normal. Pupils are equal, round, and reactive to light.  Neck: Normal range of motion. Neck supple.  Cardiovascular: Normal rate and regular rhythm.   Pulmonary/Chest: Effort normal and breath sounds normal.  Abdominal: Soft. Bowel sounds are normal.  Tender right lower quadrant  Genitourinary:  Minimally tender right flank  Musculoskeletal: Normal range of motion.  Neurological: He is alert and oriented to person, place, and time.  Skin: Skin is warm and dry.  Psychiatric: He has a normal mood and affect. His behavior is normal.  Nursing note and vitals reviewed.   ED Course  Procedures (  including critical care time) Labs Review Labs Reviewed  COMPREHENSIVE METABOLIC PANEL - Abnormal; Notable for the following:    Sodium 131 (*)    Chloride 95 (*)    Glucose, Bld 139 (*)    BUN 38 (*)    Creatinine, Ser 2.88 (*)    GFR calc non Af Amer 20 (*)    GFR calc Af Amer 23 (*)    All other components within normal limits  URINALYSIS, ROUTINE W REFLEX MICROSCOPIC (NOT AT [facility]) - Abnormal; Notable for the following:    Hgb urine dipstick TRACE (*)    Ketones, ur 15 (*)     All other components within normal limits  URINE MICROSCOPIC-ADD ON - Abnormal; Notable for the following:    Casts HYALINE CASTS (*)    All other components within normal limits  CBC    Imaging Review Dg Abd 1 View  09/13/2015  CLINICAL DATA:  Acute onset of right-sided flank pain and constipation. Initial encounter. EXAM: ABDOMEN - 1 VIEW COMPARISON:  None. FINDINGS: The visualized bowel gas pattern is unremarkable. Scattered air and stool filled loops of colon are seen; no abnormal dilatation of small bowel loops is seen to suggest small bowel obstruction. No free intra-abdominal air is identified, though evaluation for free air is limited on a single supine view. The visualized osseous structures are within normal limits; the sacroiliac joints are unremarkable in appearance. A calcification overlying the right transverse processes of L5 is nonspecific. IMPRESSION: Unremarkable bowel gas pattern; no free intra-abdominal air seen. Small to moderate amount of stool noted in the colon. Electronically Signed   By: Garald Balding M.D.   On: 09/13/2015 18:25   US Renal  09/12/2015  CLINICAL DATA:  Right ureteral stone. EXAM: RENAL / URINARY TRACT ULTRASOUND COMPLETE COMPARISON:  CT scans dated 09/04/2015 and 12/23/2013 FINDINGS: Right Kidney: Length: 12.1 cm. There is persistent hydronephrosis. Multiple renal cysts, the largest being 4 cm on the upper pole. 6 mm stone in the proximal right ureter as noted on the prior CT scan. Left Kidney: Length: 10.9 cm. Multiple peripelvic cysts as demonstrated on prior CT scan. Echogenicity within normal limits. No mass or hydronephrosis visualized. 4.3 cm cyst in the mid left kidney. Bladder: Appears normal for degree of bladder distention. Left ureteral jet is identified. IMPRESSION: Persistent hydronephrosis of the right kidney with a visible 6 mm stone in the proximal right ureter. Bilateral renal cysts as well as multiple peripelvic cysts on the left.  Electronically Signed   By: Lorriane Shire M.D.   On: 09/12/2015 15:31   I have personally reviewed and evaluated these images and lab results as part of my medical decision-making.   EKG Interpretation None     CRITICAL CARE Performed by: Nat Christen Total critical care time: 30 minutes Critical care time was exclusive of separately billable procedures and treating other patients. Critical care was necessary to treat or prevent imminent or life-threatening deterioration. Critical care was time spent personally by me on the following activities: development of treatment plan with patient and/or surrogate as well as nursing, discussions with consultants, evaluation of patient's response to treatment, examination of patient, obtaining history from patient or surrogate, ordering and performing treatments and interventions, ordering and review of laboratory studies, ordering and review of radiographic studies, pulse oximetry and re-evaluation of patient's condition. MDM   Final diagnoses:  Right ureteral stone  Renal failure    Patient has persistent pain and creatinine has elevated..  Discussed with  urologist on call Dr. Tresa Moore.  Patient will be transferred to [place], MD 09/13/15 [id number]  Nat Christen, MD 09/13/15 [id number]  Nat Christen, MD 09/13/15 [id number]

## 2015-09-13 NOTE — ED Provider Notes (Signed)
Medical screening exam. Patient sent here in transfer from [facility] for inpatient stay. For nephrolithiasis. Patient states pain is under control presently. He reports that he treated himself with 8 oxycodone tablets yesterday and with 4 tablets today. Nursing got history that patient had taken 5 Percocets over 5 hours prior to admission to [facility] emergency department earlier today. 5 Percocets  is not a toxic dose. Patient is alert appropriate Glasgow Coma Score 15 no distress  Orlie Dakin, MD 09/13/15 [id number]

## 2015-09-13 NOTE — ED Notes (Signed)
Bed: [id number] Expected date:  Expected time:  Means of arrival:  Comments: Transfer from [facility]; 38mm stone; overdose on Percocet

## 2015-09-13 NOTE — H&P (Signed)
Raymond Christian is an 74 y.o. male.    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 No prior colic episoes. NO additional Stones. Given trial of medical therapy but no interval passage and colic beocmieng refractory. Stone 581m SSD 15cm, 850HU.  2 -  Acute Renal Failure  - Baseline Cr <1.5 2015, acute rise to 2.88 by ER labs 09/13/15. Admits to poor PO intake and known right ureteral obstruction from stone.  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  PMH sig for melanoma, 3cm infrarenal AAA-iliac aneurysm, HTN, GERD.   Today "Raymond Christian seen as emergent admission for above. He has refracotry right renal colic with acute renal failure and was told [place] [facility] not knowing that [facility] is not site of [place] [facility] for [facility] this AM.   Past Medical History  Diagnosis Date  . Essential (primary) hypertension 07/09/2014  . Gastro-esophageal reflux disease without esophagitis 07/09/2014  . Anxiety 07/09/2014  . HLD (hyperlipidemia) 07/09/2014  . Malignant [place]  . [place] [facility] [place]  [facility]    Past Surgical History  Procedure Laterality Date  . Hernia repair      x2  . Melanoma excision      Family History   Problem Relation Age of Onset  . Diabetes Sister   . Kidney disease Sister    Social History:  reports that he has never smoked. He does not have any smokeless tobacco history on file. He reports that he does not drink alcohol or use illicit drugs.  Allergies: No Known Allergies   (Not in a hospital admission)  Results for orders placed or performed during the hospital encounter of 09/13/15 (from the past 48 hour(s))  Comprehensive metabolic panel     Status: Abnormal   Collection Time: 09/13/15  4:56 PM  Result Value Ref Range   Sodium 131 (L) 135 - 145 mmol/L   Potassium 5.0 3.5 - 5.1 mmol/L   Chloride 95 (L) 101 - 111 mmol/L   CO2 23 22 - 32 mmol/L   Glucose, Bld 139 (H) 65 - 99 mg/dL   BUN 38 (H) 6 - 20 mg/dL   Creatinine, Ser 2.88 (H) 0.61 - 1.24 mg/dL   Calcium 9.0 8.9 - 10.3 mg/dL   Total Protein 6.8 6.5 - 8.1 g/dL   Albumin 3.9 3.5 - 5.0 g/dL   AST 25 15 - 41 U/L   ALT 20 17 - 63 U/L   Alkaline Phosphatase 43 38 - 126 U/L   Total Bilirubin 0.7 0.3 - 1.2 mg/dL   GFR calc non Af Amer 20 (L) >60 mL/min   GFR calc Af Amer 23 (L) >60 mL/min    Comment: (NOTE) The eGFR has been calculated using the CKD  EPI equation. This calculation has not been validated in all clinical situations. eGFR's persistently <60 mL/min signify possible Chronic Kidney Disease.    Anion gap 13 5 - 15  CBC     Status: None   Collection Time: 09/13/15  4:56 PM  Result Value Ref Range   WBC 8.2 4.0 - 10.5 K/uL   RBC 4.30 4.22 - 5.81 MIL/uL   Hemoglobin 14.0 13.0 - 17.0 g/dL   HCT 40.3 39.0 - 52.0 %   MCV 93.7 78.0 - 100.0 fL   MCH 32.6 26.0 - 34.0 pg   MCHC 34.7 30.0 - 36.0 g/dL   RDW 13.0 11.5 - 15.5 %   Platelets 205 150 - 400 K/uL  Urinalysis, Routine w reflex microscopic (not at [facility])     Status: Abnormal   Collection Time: 09/13/15  7:21 PM  Result Value Ref Range   Color, Urine YELLOW YELLOW   APPearance CLEAR CLEAR   Specific Gravity, Urine 1.024 1.005 - 1.030   pH 5.0 5.0 -  8.0   Glucose, UA NEGATIVE NEGATIVE mg/dL   Hgb urine dipstick TRACE (A) NEGATIVE   Bilirubin Urine NEGATIVE NEGATIVE   Ketones, ur 15 (A) NEGATIVE mg/dL   Protein, ur NEGATIVE NEGATIVE mg/dL   Nitrite NEGATIVE NEGATIVE   Leukocytes, UA NEGATIVE NEGATIVE  Urine microscopic-add on     Status: Abnormal   Collection Time: 09/13/15  7:21 PM  Result Value Ref Range   Squamous Epithelial / LPF NONE SEEN NONE SEEN    Comment: Please note change in reference range.   WBC, UA 0-5 0 - 5 WBC/hpf    Comment: Please note change in reference range.   RBC / HPF 0-5 0 - 5 RBC/hpf    Comment: Please note change in reference range.   Bacteria, UA NONE SEEN NONE SEEN    Comment: Please note change in reference range.   Casts HYALINE CASTS (A) NEGATIVE   Dg Abd 1 View  09/13/2015  CLINICAL DATA:  Acute onset of right-sided flank pain and constipation. Initial encounter. EXAM: ABDOMEN - 1 VIEW COMPARISON:  None. FINDINGS: The visualized bowel gas pattern is unremarkable. Scattered air and stool filled loops of colon are seen; no abnormal dilatation of small bowel loops is seen to suggest small bowel obstruction. No free intra-abdominal air is identified, though evaluation for free air is limited on a single supine view. The visualized osseous structures are within normal limits; the sacroiliac joints are unremarkable in appearance. A calcification overlying the right transverse processes of L5 is nonspecific. IMPRESSION: Unremarkable bowel gas pattern; no free intra-abdominal air seen. Small to moderate amount of stool noted in the colon. Electronically Signed   By: Jeffery  Chang M.D.   On: 09/13/2015 18:25   Us Renal  09/12/2015  CLINICAL DATA:  Right ureteral stone. EXAM: RENAL / URINARY TRACT ULTRASOUND COMPLETE COMPARISON:  CT scans dated 09/04/2015 and 12/23/2013 FINDINGS: Right Kidney: Length: 12.1 cm. There is persistent hydronephrosis. Multiple renal cysts, the largest being 4 cm on the upper pole. 6  mm stone in the proximal right ureter as noted on the prior CT scan. Left Kidney: Length: 10.9 cm. Multiple peripelvic cysts as demonstrated on prior CT scan. Echogenicity within normal limits. No mass or hydronephrosis visualized. 4.3 cm cyst in the mid left kidney. Bladder: Appears normal for degree of bladder distention. Left ureteral jet is identified. IMPRESSION: Persistent hydronephrosis of the right kidney with a visible 6 mm stone in the proximal right   ureter. Bilateral renal cysts as well as multiple peripelvic cysts on the left. Electronically Signed   By: James  Maxwell M.D.   On: 09/12/2015 15:31    Review of Systems  Constitutional: Negative.  Negative for fever and chills.  HENT: Negative.   Eyes: Negative.   Respiratory: Negative.   Cardiovascular: Negative.   Gastrointestinal: Positive for nausea. Negative for vomiting.  Genitourinary: Positive for flank pain.  Musculoskeletal: Negative.   Skin: Negative.   Neurological: Negative.   Endo/Heme/Allergies: Negative.   Psychiatric/Behavioral: Negative.     Blood pressure 129/66, pulse 51, temperature 98.3 F (36.8 C), temperature source Oral, resp. rate 17, weight 82.101 kg (181 lb), SpO2 94 %. Physical Exam  Constitutional: He appears well-developed.  Family at bedside  HENT:  Head: Normocephalic.  Eyes: Pupils are equal, round, and reactive to light.  Neck: Normal range of motion.  Cardiovascular: Normal rate.   Respiratory: Effort normal.  GI: Soft.  Genitourinary:  Mild Rt CVAT at present  Musculoskeletal: Normal range of motion.  Neurological: He is alert.  Skin: Skin is warm.  Psychiatric: He has a normal mood and affect. His behavior is normal. Judgment and thought content normal.     Assessment/Plan   1 - Right Ureteral Stone - pain refractory and with acute renal failure. Agree decompression warranted. Discussed recommended path of cysto / retrograde / ureteroscopic stone manipulation and / or stent  pending intra-op anatomy and he wishes to proceed. Risks, benefits, alternatives discussed. Last meal yesterday. UA without infectious parameters.     2 -  Acute Renal Failure  - likely combination pre-renal dehydration and post-renal obstructive from stone above. Plan as per above.   3 - Moderate Risk Prostate Cancer- agree with XRT in elective setting v. Surveillance alone. No indication for furhter management this hospitalization.   4 -  Bilateral Non-Complex Renal Cysts - stable and w/o worrisome features on imaging x several, observe.  MANNY, THEODORE 09/13/2015, 8:32 PM    

## 2015-09-13 NOTE — ED Notes (Signed)
Paged [facility] to Dr. Lacinda Axon @ [phone]

## 2015-09-13 NOTE — Telephone Encounter (Signed)
Spoke with pt and reinforced the need to push fluids. Pt will RTC Wednesday for labs and see Larene Beach to discuss surgery. Pt voiced understanding.

## 2015-09-14 ENCOUNTER — Observation Stay (HOSPITAL_COMMUNITY): Payer: Commercial Managed Care - PPO | Admitting: Certified Registered"

## 2015-09-14 ENCOUNTER — Encounter (HOSPITAL_COMMUNITY): Payer: Self-pay | Admitting: Emergency Medicine

## 2015-09-14 ENCOUNTER — Encounter: Payer: Self-pay | Admitting: Urology

## 2015-09-14 ENCOUNTER — Ambulatory Visit: Payer: Medicare Other | Admitting: Urology

## 2015-09-14 ENCOUNTER — Telehealth: Payer: Self-pay | Admitting: Urology

## 2015-09-14 ENCOUNTER — Encounter (HOSPITAL_COMMUNITY)
Admission: EM | Disposition: A | Discharge status: Home / Self Care | Payer: Self-pay | Source: Home / Self Care | Attending: Urology

## 2015-09-14 HISTORY — PX: HOLMIUM LASER APPLICATION: SHX5852

## 2015-09-14 HISTORY — PX: CYSTOSCOPY WITH RETROGRADE PYELOGRAM, URETEROSCOPY AND STENT PLACEMENT: SHX5789

## 2015-09-14 LAB — CULTURE, URINE COMPREHENSIVE

## 2015-09-14 SURGERY — CYSTOURETEROSCOPY, WITH RETROGRADE PYELOGRAM AND STENT INSERTION
Anesthesia: General | Site: Ureter | Laterality: Right

## 2015-09-14 MED ORDER — PROPOFOL 10 MG/ML IV BOLUS
INTRAVENOUS | Status: AC
  Filled 2015-09-14: qty 20

## 2015-09-14 MED ORDER — ONDANSETRON HCL 4 MG/2ML IJ SOLN: 4.0000 mg | Freq: Once | INTRAMUSCULAR | Status: DC | PRN

## 2015-09-14 MED ORDER — EPHEDRINE SULFATE 50 MG/ML IJ SOLN
INTRAMUSCULAR | Status: AC
  Filled 2015-09-14: qty 1

## 2015-09-14 MED ORDER — EPHEDRINE SULFATE 50 MG/ML IJ SOLN
INTRAMUSCULAR | Status: DC | PRN
  Administered 2015-09-14: 5 mg via INTRAVENOUS

## 2015-09-14 MED ORDER — FENTANYL CITRATE (PF) 100 MCG/2ML IJ SOLN
INTRAMUSCULAR | Status: DC | PRN
  Administered 2015-09-14: 50 ug via INTRAVENOUS

## 2015-09-14 MED ORDER — FENTANYL CITRATE (PF) 100 MCG/2ML IJ SOLN
INTRAMUSCULAR | Status: AC
  Filled 2015-09-14: qty 2

## 2015-09-14 MED ORDER — FENTANYL CITRATE (PF) 100 MCG/2ML IJ SOLN
INTRAMUSCULAR | Status: DC
Start: 2015-09-14 — End: 2015-09-14
  Filled 2015-09-14: qty 2

## 2015-09-14 MED ORDER — PROPOFOL 10 MG/ML IV BOLUS
INTRAVENOUS | Status: DC | PRN
  Administered 2015-09-14: 180 mg via INTRAVENOUS

## 2015-09-14 MED ORDER — SODIUM CHLORIDE 0.9 % IR SOLN
Status: DC | PRN
  Administered 2015-09-14: 3000 mL

## 2015-09-14 MED ORDER — CEPHALEXIN 500 MG PO CAPS: 500.0000 mg | ORAL_CAPSULE | Freq: Three times a day (TID) | ORAL | Status: DC

## 2015-09-14 MED ORDER — LACTATED RINGERS IV SOLN
INTRAVENOUS | Status: DC | PRN
  Administered 2015-09-14: 06:00:00 via INTRAVENOUS

## 2015-09-14 MED ORDER — OXYCODONE-ACETAMINOPHEN 5-325 MG PO TABS: 1.0000 | ORAL_TABLET | Freq: Four times a day (QID) | ORAL | Status: DC | PRN

## 2015-09-14 MED ORDER — IOHEXOL 300 MG/ML  SOLN
INTRAMUSCULAR | Status: DC | PRN
  Administered 2015-09-14: 20 mL via URETHRAL

## 2015-09-14 MED ORDER — LIDOCAINE HCL (CARDIAC) 20 MG/ML IV SOLN
INTRAVENOUS | Status: DC | PRN
  Administered 2015-09-14: 50 mg via INTRAVENOUS

## 2015-09-14 MED ORDER — LIDOCAINE HCL (CARDIAC) 20 MG/ML IV SOLN
INTRAVENOUS | Status: AC
  Filled 2015-09-14: qty 5

## 2015-09-14 MED ORDER — FENTANYL CITRATE (PF) 100 MCG/2ML IJ SOLN
25.0000 ug | INTRAMUSCULAR | Status: DC | PRN
  Administered 2015-09-14: 25 ug via INTRAVENOUS
  Administered 2015-09-14: 50 ug via INTRAVENOUS
  Administered 2015-09-14: 25 ug via INTRAVENOUS

## 2015-09-14 MED ORDER — LACTATED RINGERS IV SOLN: INTRAVENOUS | Status: DC

## 2015-09-14 MED ORDER — SENNOSIDES-DOCUSATE SODIUM 8.6-50 MG PO TABS: 1.0000 | ORAL_TABLET | Freq: Two times a day (BID) | ORAL | Status: DC

## 2015-09-14 MED ORDER — 0.9 % SODIUM CHLORIDE (POUR BTL) OPTIME
TOPICAL | Status: DC | PRN
  Administered 2015-09-14: 1000 mL

## 2015-09-14 SURGICAL SUPPLY — 23 items
BASKET LASER NITINOL 1.9FR (BASKET) ×3 IMPLANT
BASKET STNLS GEMINI 4WIRE 3FR (BASKET) IMPLANT
BASKET ZERO TIP NITINOL 2.4FR (BASKET) IMPLANT
CATH INTERMIT  6FR 70CM (CATHETERS) ×3 IMPLANT
CLOTH BEACON ORANGE TIMEOUT ST (SAFETY) ×3 IMPLANT
ELECT REM PT RETURN 9FT ADLT (ELECTROSURGICAL)
ELECTRODE REM PT RTRN 9FT ADLT (ELECTROSURGICAL) IMPLANT
FIBER LASER FLEXIVA 1000 (UROLOGICAL SUPPLIES) IMPLANT
FIBER LASER FLEXIVA 200 (UROLOGICAL SUPPLIES) ×3 IMPLANT
FIBER LASER FLEXIVA 365 (UROLOGICAL SUPPLIES) IMPLANT
FIBER LASER FLEXIVA 550 (UROLOGICAL SUPPLIES) IMPLANT
FIBER LASER TRAC TIP (UROLOGICAL SUPPLIES) IMPLANT
GLOVE BIOGEL M STRL SZ7.5 (GLOVE) ×3 IMPLANT
GOWN STRL REUS W/TWL XL LVL3 (GOWN DISPOSABLE) ×6 IMPLANT
GUIDEWIRE ANG ZIPWIRE 038X150 (WIRE) ×3 IMPLANT
GUIDEWIRE STR DUAL SENSOR (WIRE) ×3 IMPLANT
IV NS IRRIG 3000ML ARTHROMATIC (IV SOLUTION) ×3 IMPLANT
PACK CYSTO (CUSTOM PROCEDURE TRAY) ×3 IMPLANT
SHEATH ACCESS URETERAL 38CM (SHEATH) ×3 IMPLANT
STENT POLARIS 5FRX26 (STENTS) ×3 IMPLANT
SYRINGE 10CC LL (SYRINGE) IMPLANT
SYRINGE IRR TOOMEY STRL 70CC (SYRINGE) IMPLANT
TUBE FEEDING 8FR 16IN STR KANG (MISCELLANEOUS) ×3 IMPLANT

## 2015-09-14 NOTE — Care Management Note (Signed)
Case Management Note  Patient Details  Name: Raymond Christian MRN: [id number] Date of Birth: 10-Nov-1940  Subjective/Objective:  74 y/o m admitted w/R kidney stone. From home.                  Action/Plan:d/c home no needs or orders.   Expected Discharge Date:                  Expected Discharge Plan:  Home/Self Care  In-House Referral:     Discharge planning Services  CM Consult  Post Acute Care Choice:    Choice offered to:     DME Arranged:    DME Agency:     HH Arranged:    [place] Agency:     Status of Service:  Completed, signed off  Medicare Important Message Given:    Date Medicare IM Given:    Medicare IM give by:    Date Additional Medicare IM Given:    Additional Medicare Important Message give by:     If discussed at [place] of Stay Meetings, dates discussed:    Additional Comments:  Dessa Phi, RN 09/14/2015, 11:46 AM

## 2015-09-14 NOTE — Anesthesia Preprocedure Evaluation (Signed)
Anesthesia Evaluation  Patient identified by MRN, date of birth, ID band Patient awake    Reviewed: Allergy & Precautions, NPO status , Patient's Chart, lab work & pertinent test results  History of Anesthesia Complications Negative for: history of anesthetic complications  Airway Mallampati: II  TM Distance: >3 FB Neck ROM: Full    Dental  (+) Teeth Intact, Dental Advisory Given, Missing   Pulmonary neg pulmonary ROS,    Pulmonary exam normal breath sounds clear to auscultation       Cardiovascular Exercise Tolerance: Good hypertension, Pt. on medications (-) angina(-) CAD and (-) Past MI Normal cardiovascular exam Rhythm:Regular Rate:Normal     Neuro/Psych PSYCHIATRIC DISORDERS Anxiety negative neurological ROS     GI/Hepatic Neg liver ROS, GERD  Medicated,  Endo/Other  negative endocrine ROS  Renal/GU negative Renal ROS     Musculoskeletal negative musculoskeletal ROS (+)   Abdominal   Peds  Hematology negative hematology ROS (+)   Anesthesia Other Findings Day of surgery medications reviewed with the patient.  Reproductive/Obstetrics                             Anesthesia Physical Anesthesia Plan  ASA: II  Anesthesia Plan: General   Post-op Pain Management:    Induction: Intravenous  Airway Management Planned: LMA  Additional Equipment:   Intra-op Plan:   Post-operative Plan: Extubation in OR  Informed Consent: I have reviewed the patients History and Physical, chart, labs and discussed the procedure including the risks, benefits and alternatives for the proposed anesthesia with the patient or authorized representative who has indicated his/her understanding and acceptance.   Dental advisory given  Plan Discussed with: CRNA  Anesthesia Plan Comments: (Risks/benefits of general anesthesia discussed with patient including risk of damage to teeth, lips, gum, and tongue,  nausea/vomiting, allergic reactions to medications, and the possibility of heart attack, stroke and death.  All patient questions answered.  Patient wishes to proceed.)        Anesthesia Quick Evaluation

## 2015-09-14 NOTE — Transfer of Care (Signed)
Immediate Anesthesia Transfer of Care Note  Patient: Raymond Christian  Procedure(s) Performed: Procedure(s): CYSTOSCOPY WITH RETROGRADE PYELOGRAM, URETEROSCOPY AND STENT PLACEMENT (Right) HOLMIUM LASER APPLICATION (Right)  Patient Location: PACU  Anesthesia Type:General  Level of Consciousness: awake  Airway & Oxygen Therapy: Patient Spontanous Breathing and Patient connected to face mask oxygen  Post-op Assessment: Report given to RN and Post -op Vital signs reviewed and stable  Post vital signs: Reviewed and stable  Last Vitals:  Filed Vitals:   09/14/15 0526  BP: 140/79  Pulse: 76  Temp: 36.8 C  Resp: 18    Complications: No apparent anesthesia complications

## 2015-09-14 NOTE — Discharge Instructions (Signed)
1 - You may have urinary urgency (bladder spasms) and bloody urine on / off with stent in place. This is normal. ° °2 - Call MD or go to ER for fever >102, severe pain / nausea / vomiting not relieved by medications, or acute change in medical status ° °

## 2015-09-14 NOTE — Anesthesia Procedure Notes (Signed)
Procedure Name: LMA Insertion Date/Time: 09/14/2015 6:03 AM Performed by: Noralyn Pick D Pre-anesthesia Checklist: Patient identified, Emergency Drugs available, Suction available and Patient being monitored Patient Re-evaluated:Patient Re-evaluated prior to inductionOxygen Delivery Method: Circle System Utilized Preoxygenation: Pre-oxygenation with 100% oxygen Intubation Type: IV induction Ventilation: Mask ventilation without difficulty LMA Size: 4.0 Tube type: Oral Number of attempts: 1 Placement Confirmation: positive ETCO2 and breath sounds checked- equal and bilateral Tube secured with: Tape Dental Injury: Teeth and Oropharynx as per pre-operative assessment

## 2015-09-14 NOTE — Brief Op Note (Signed)
09/13/2015 - 09/14/2015  6:41 AM  PATIENT:  Raymond Christian  74 y.o. male  PRE-OPERATIVE DIAGNOSIS:  Right ureteral blockage  POST-OPERATIVE DIAGNOSIS:  right ureteral stone   PROCEDURE:  Procedure(s): CYSTOSCOPY WITH RETROGRADE PYELOGRAM, URETEROSCOPY AND STENT PLACEMENT (Right) HOLMIUM LASER APPLICATION (Right)  SURGEON:  Surgeon(s) and Role:    * Alexis Frock, MD - Primary  PHYSICIAN ASSISTANT:   ASSISTANTS: none   ANESTHESIA:   general  EBL:  Total I/O In: -  Out: 400 [Urine:400]  BLOOD ADMINISTERED:none  DRAINS: none   LOCAL MEDICATIONS USED:  NONE  SPECIMEN:  Source of Specimen:  Rt ureteral stone fragments  DISPOSITION OF SPECIMEN:  [facility] for compositional analysis  COUNTS:  YES  TOURNIQUET:  * No tourniquets in log *  DICTATION: .Other Dictation: Dictation Number [id number]  PLAN OF CARE: Admit for overnight observation  PATIENT DISPOSITION:  PACU - hemodynamically stable.   Delay start of Pharmacological VTE agent (>24hrs) due to surgical blood loss or risk of bleeding: not applicable

## 2015-09-14 NOTE — Telephone Encounter (Signed)
Patient's sister called last evening and stated she was carrying him to the [facility] ED because he was in pain and talking out of his head.

## 2015-09-15 ENCOUNTER — Ambulatory Visit: Payer: Commercial Managed Care - PPO

## 2015-09-15 NOTE — Op Note (Signed)
NAMEMOIR, BANEY             ACCOUNT NO.:  [id number]  MEDICAL RECORD NO.:  [id number]  LOCATION:  43                         FACILITY:  [facility]  PHYSICIAN:  Alexis Frock, MD     DATE OF BIRTH:  03-11-1941  DATE OF PROCEDURE:  09/14/2015                              OPERATIVE REPORT   PREOPERATIVE DIAGNOSES:  Right ureteral stone.  Acute renal failure. Refractory renal colic.  PROCEDURES: 1. Cystoscopy with right retrograde pyelogram interpretation. 2. Right ureteroscopy with laser lithotripsy. 3. Insertion of right ureteral stent, 5 x 26, Polaris, no tether.  SURGEON:  Alexis Frock, MD.  ESTIMATED BLOOD LOSS:  Nil.  COMPLICATIONS:  None.  SPECIMENS:  Right renal stone fragments for compositional analysis.  FINDINGS: 1. Mild right hydroureteronephrosis. 2. Retrograde positioning of right proximal ureteral stone into the     upper pole calyx. 3. Complete resolution of all stone fragments larger than one-third     millimeter following laser lithotripsy and basket extraction. 4. Successful placement of right ureteral stent, proximal in the upper     pole, distal in urinary bladder.  INDICATIONS:  Mr. Hirota is a pleasant 74 year old gentleman with recent history of moderate risk prostate cancer.  He unfortunately was found to also have a right proximal ureteral stone on workup of colicky flank pain.  He underwent a trial of medical therapy; however, his symptoms became refractory; and he presented to the [facility] ER late last night.  He desired definitive management.  He also was noted to be in acute renal failure.  Baseline creatinine was 1.5, and his creatinine had creeped up to over 2.8.  Options were discussed including stenting alone versus nephrostomy versus attempt to right ureteroscopic stone manipulation with the goal being to avoid multiple procedures.  He wished to proceed with the latter.  Informed consent was obtained and placed in the medical  record.  PROCEDURE IN DETAIL:  The patient being Keishaun Ludden was verified. Procedure being right ureteroscopic stone manipulation was confirmed. Procedure was carried out.  Time-out was performed.  Intravenous antibiotics were administered.  General LMA anesthesia was induced.  The patient was placed into a low lithotomy position.  Sterile field was created by prepping and draping the patient's penis, perineum, and proximal thighs using iodine x3.  Next, cystourethroscopy was performed using a 23-French rigid cystoscope with a 30-degree offset lens. Inspection of the anterior and posterior urethra were unremarkable. Inspection of the bladder revealed no diverticula, calcifications, or papular lesions.  The right ureteral orifice was cannulated with a 6- French Foley catheter, and right retrograde pyelogram was obtained.  Right retrograde pyelogram demonstrated a single right ureter, single system right kidney.  There was mild hydroureteronephrosis with a filling defect in the mid ureter consistent with a known stone.  A 0.038 zip wire was advanced at the level of the upper pole and set aside as a safety wire.  Next, semi-rigid ureteroscopy was performed of the distal two-thirds of the right ureter alongside a separate Sensor working wire. No mucosal abnormalities were seen.  As such, the semi-rigid ureteroscope was exchanged for a 12/14, 38-cm ureteral access sheath at the level of the proximal third of the ureter,  no higher than where it had been previously directly inspected with the semi-rigid scope.  An 8- [place] feeding tube was placed in the urinary bladder for pressure release.  Next, a flexible digital ureteroscopy was performed using a single channel flexible ureteroscope.  Inspection of the proximal ureter revealed no stones, but inspection of the kidney revealed a single dominant stone that had been positioned in the upper pole calyx.  This likely represented prior  retrograde positioning of the ureteral stone. This appeared to be much too large for simple basketing.  As such, holmium laser energy applied to the stone in fragmenting it in approximately three smaller pieces.  These were then grasped on their long axis with an Escape basket removing in their entirety, set aside for compositional analysis.  Following these maneuvers, the kidney was inspected x2, and there was complete resolution of stone fragments larger than one-third millimeter.  The access sheath was removed under continuous ureteroscopic vision.  No significant mucosal abnormalities were seen.  Given the use of access sheath in the patient's acute renal failure, it was felt that interval stenting would be warranted.  As such, a new 5 x 26 Polaris type stent was placed with the use of a safety wire using fluoroscopic guidance in the proximal end of upper pole, distal in urinary bladder.  The procedure was terminated.  The patient tolerated the procedure well.  There were no immediate periprocedural complications.  The patient was taken to the postanesthesia care unit in stable condition with plan for discharge later this morning.          ______________________________ Alexis Frock, MD     TM/MEDQ  D:  09/14/2015  T:  09/14/2015  Job:  [id number]

## 2015-09-16 NOTE — Anesthesia Postprocedure Evaluation (Signed)
  Anesthesia Post-op Note  Patient: Raymond Christian  Procedure(s) Performed: Procedure(s) (LRB): CYSTOSCOPY WITH RETROGRADE PYELOGRAM, URETEROSCOPY AND STENT PLACEMENT (Right) HOLMIUM LASER APPLICATION (Right)  Patient Location: PACU  Anesthesia Type: General  Level of Consciousness: awake and alert   Airway and Oxygen Therapy: Patient Spontanous Breathing  Post-op Pain: mild  Post-op Assessment: Post-op Vital signs reviewed, Patient's Cardiovascular Status Stable, Respiratory Function Stable, Patent Airway and No signs of Nausea or vomiting  Last Vitals:  Filed Vitals:   09/14/15 0815  BP: 141/59  Pulse: 86  Temp: 37.1 C  Resp:     Post-op Vital Signs: stable   Complications: No apparent anesthesia complications

## 2015-09-19 ENCOUNTER — Other Ambulatory Visit: Payer: Commercial Managed Care - PPO

## 2015-09-20 ENCOUNTER — Telehealth: Payer: Self-pay | Admitting: Radiology

## 2015-09-20 ENCOUNTER — Ambulatory Visit: Payer: Medicare Other

## 2015-09-20 NOTE — Telephone Encounter (Signed)
-----   Message from Tommy Rainwater sent at 09/16/2015  4:43 PM EST ----- Regarding: FW: follow up.    ----- Message -----    From: Alexis Frock, MD    Sent: 09/14/2015   8:25 AM      To: Tommy Rainwater Subject: follow up.                                      Raymond Christian will need f/u in [place] in about 1 mo for cyst stent pull AND goldseed marker placement if possible.   He wound up having emergent stone surgery in [place] last night, but would like to have f/u there if we can.   I'd like to do if can be seen on day I'm there, but OK if another MD does if needed.   Thanks, T. Tresa Moore

## 2015-09-20 NOTE — Telephone Encounter (Signed)
Pt notified of gold seed placement & cysto stent removal scheduled 10/11/15 @10 :15. Pt advised to use Fleets enema morning of procedure and to have breakfast. Pt denies use of ASA, Ibuprofen or blood thinners. Pt voices understanding.

## 2015-09-20 NOTE — Telephone Encounter (Signed)
Pt notified of below. Appt scheduled.

## 2015-09-26 ENCOUNTER — Ambulatory Visit: Admission: RE | Admit: 2015-09-26 | Payer: Commercial Managed Care - PPO | Source: Ambulatory Visit | Admitting: Urology

## 2015-09-26 ENCOUNTER — Encounter: Admission: RE | Payer: Self-pay | Source: Ambulatory Visit

## 2015-09-26 ENCOUNTER — Ambulatory Visit: Payer: Medicare Other | Admitting: Urology

## 2015-09-26 SURGERY — URETEROSCOPY, WITH LITHOTRIPSY USING HOLMIUM LASER
Anesthesia: Choice | Laterality: Right

## 2015-10-11 ENCOUNTER — Ambulatory Visit (INDEPENDENT_AMBULATORY_CARE_PROVIDER_SITE_OTHER): Payer: Commercial Managed Care - PPO | Admitting: Urology

## 2015-10-11 ENCOUNTER — Encounter: Payer: Self-pay | Admitting: Urology

## 2015-10-11 VITALS — BP 133/85 | HR 103 | Ht 68.0 in | Wt 174.2 lb

## 2015-10-11 DIAGNOSIS — N201 Calculus of ureter: Secondary | ICD-10-CM

## 2015-10-11 DIAGNOSIS — C61 Malignant neoplasm of prostate: Secondary | ICD-10-CM | POA: Diagnosis not present

## 2015-10-11 LAB — URINALYSIS, COMPLETE
BILIRUBIN UA: NEGATIVE
Glucose, UA: NEGATIVE
KETONES UA: NEGATIVE
NITRITE UA: NEGATIVE
Protein, UA: NEGATIVE
SPEC GRAV UA: 1.02 (ref 1.005–1.030)
UUROB: 0.2 mg/dL (ref 0.2–1.0)
pH, UA: 6.5 (ref 5.0–7.5)

## 2015-10-11 LAB — MICROSCOPIC EXAMINATION: Bacteria, UA: NONE SEEN

## 2015-10-11 MED ORDER — LEVOFLOXACIN 500 MG PO TABS
500.0000 mg | ORAL_TABLET | Freq: Once | ORAL | Status: AC
  Administered 2015-10-11: 500 mg via ORAL

## 2015-10-11 MED ORDER — CIPROFLOXACIN HCL 500 MG PO TABS: 500.0000 mg | ORAL_TABLET | Freq: Once | ORAL | Status: DC

## 2015-10-11 MED ORDER — LIDOCAINE HCL 2 % EX GEL
1.0000 "application " | Freq: Once | CUTANEOUS | Status: AC
  Administered 2015-10-11: 1 via URETHRAL

## 2015-10-11 NOTE — Progress Notes (Signed)
10/11/2015 10:47 AM   Raymond Christian 06/01/1941 [id number]  Referring provider: Sherrin Daisy, MD [place], [place] [phone]  Chief Complaint  Patient presents with  . Cysto Stent Removal    gold seed     HPI:  1 - Right Ureteral Stone - s/p right ureteroscopy to stone free 09/2015 for first episode colic from 53mm prox ureteral stone. No additional stones.   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  3 - Bilateral Non-Complex Renal Cysts - Rt upper 3c, Rt lower 3cm, Lt peri-pelvic non-complex on renal imaging x several includgin contrast CT 2015, non-con CT 2016 and renal US. No nodules or enhacement.  PMH sig for melanoma, 3cm infrarenal AAA-iliac aneurysm, HTN, GERD.   Today "Raymond Christian" is seen to proceed with fiducaial marker placement in preparation for EBRT for prostate cancer and cysto stent pull.    PMH: Past Medical History  Diagnosis Date  . Essential (primary) hypertension 07/09/2014  . Gastro-esophageal reflux disease without esophagitis 07/09/2014  . Anxiety 07/09/2014  . HLD (hyperlipidemia) 07/09/2014  . Malignant melanoma ([place]) 07/09/2014  . Prostate cancer ([place]) 06/2015  . Prostate cancer [facility])     Surgical History: Past Surgical History  Procedure Laterality Date  . Hernia repair      x2  . Melanoma excision    . Cystoscopy with retrograde pyelogram, ureteroscopy and stent placement Right 09/14/2015    Procedure: [place], URETEROSCOPY AND STENT PLACEMENT;  Surgeon: Alexis Frock, MD;  Location: [facility];  Service: Urology;  Laterality: Right;  . Holmium laser application Right [id number]    Procedure: HOLMIUM LASER APPLICATION;  Surgeon: Alexis Frock, MD;  Location: [facility];  Service: Urology;  Laterality: Right;    Home Medications:      Medication List       This list is accurate as of: 10/11/15 10:47 AM.  Always use your most recent med list.               cephALEXin 500 MG capsule  Commonly known as:  KEFLEX  Take 1 capsule (500 mg total) by mouth 3 (three) times daily. X 3 days. Begin day before next Urology appointment.     lisinopril-hydrochlorothiazide 20-12.5 MG tablet  Commonly known as:  PRINZIDE,ZESTORETIC  Take 1 tablet by mouth daily.     omeprazole 20 MG capsule  Commonly known as:  PRILOSEC  Take 20 mg by mouth every other day.     ondansetron 4 MG disintegrating tablet  Commonly known as:  ZOFRAN ODT  Take 1 tablet (4 mg total) by mouth every 8 (eight) hours as needed for nausea or vomiting.     oxyCODONE-acetaminophen 7.5-325 MG tablet  Commonly known as:  PERCOCET  TK 1 T PO Q 4 H PRF SEVERE PAIN.     senna-docusate 8.6-50 MG tablet  Commonly known as:  Senokot-S  Take 1 tablet by mouth 2 (two) times daily. While taking pain meds to prevent constipation     simvastatin 20 MG tablet  Commonly known as:  ZOCOR  Take 20 mg by mouth at bedtime.     tamsulosin 0.4 MG Caps capsule  Commonly known as:  FLOMAX  Take 1 capsule (0.4 mg total) by mouth daily.        Allergies: No Known Allergies  Family History:  Family History  Problem Relation Age of Onset  . Diabetes Sister   . Kidney disease Sister     Social History:  reports that he has never smoked. He does not have any smokeless tobacco history on file. He reports that he does not drink alcohol or use illicit drugs.  ROS: UROLOGY Frequent Urination?: No Hard to postpone urination?: No Burning/pain with urination?: No Get up at night to urinate?: No Leakage of urine?: No Urine stream starts and stops?: No Trouble starting stream?: No Do you have to strain to urinate?: No Blood in urine?: No Urinary tract infection?: No Sexually transmitted disease?: No Injury to kidneys or bladder?: No Painful intercourse?: No Weak  stream?: No Erection problems?: No Penile pain?: No  Gastrointestinal Nausea?: No Vomiting?: No Indigestion/heartburn?: No Diarrhea?: No Constipation?: No  Constitutional Fever: No Night sweats?: No Weight loss?: No Fatigue?: No  Skin Skin rash/lesions?: No Itching?: No  Eyes Blurred vision?: No Double vision?: No  Ears/Nose/Throat Sore throat?: No Sinus problems?: No  Hematologic/Lymphatic Swollen glands?: No Easy bruising?: No  Cardiovascular Leg swelling?: No Chest pain?: No  Respiratory Cough?: No Shortness of breath?: No  Endocrine Excessive thirst?: No  Musculoskeletal Back pain?: No Joint pain?: No  Neurological Headaches?: No Dizziness?: No  Psychologic Depression?: No Anxiety?: No  Physical Exam: BP 133/85 mmHg  Pulse 103  Ht 5\' 8"  (1.727 m)  Wt 79.017 kg (174 lb 3.2 oz)  BMI 26.49 kg/m2  Constitutional:  Alert and oriented, No acute distress. HEENT: [place] AT, moist mucus membranes.  Trachea midline, no masses. Cardiovascular: No clubbing, cyanosis, or edema. Respiratory: Normal respiratory effort, no increased work of breathing. GI: Abdomen is soft, nontender, nondistended, no abdominal masses GU: No CVA tenderness. Circumcised, phallus straight.  Skin: No rashes, bruises or suspicious lesions. Lymph: No cervical or inguinal adenopathy. Neurologic: Grossly intact, no focal deficits, moving all 4 extremities. Psychiatric: Normal mood and affect.  Laboratory Data: Lab Results  Component Value Date   WBC 8.2 09/13/2015   HGB 14.0 09/13/2015   HCT 40.3 09/13/2015   MCV 93.7 09/13/2015   PLT 205 09/13/2015    Lab Results  Component Value Date   CREATININE 2.88* 09/13/2015    Lab Results  Component Value Date   PSA 4.3* 08/01/2015   PSA 2.8 08/06/2012   PSA 4.4* 01/23/2012    No results found for: TESTOSTERONE  Lab Results  Component Value Date   HGBA1C 6.0 12/24/2013    Urinalysis    Component Value Date/Time    COLORURINE YELLOW 09/13/2015 1921   COLORURINE Amber 12/22/2013 2307   APPEARANCEUR CLEAR 09/13/2015 1921   APPEARANCEUR Hazy 12/22/2013 2307   LABSPEC 1.024 09/13/2015 1921   LABSPEC 1.027 12/22/2013 2307   PHURINE 5.0 09/13/2015 1921   PHURINE 5.0 12/22/2013 2307   GLUCOSEU NEGATIVE 09/13/2015 1921   GLUCOSEU Negative 12/22/2013 2307   HGBUR TRACE* 09/13/2015 1921   HGBUR Negative 12/22/2013 2307   BILIRUBINUR NEGATIVE 09/13/2015 1921   BILIRUBINUR Negative 09/12/2015 0915   BILIRUBINUR Negative 12/22/2013 2307   KETONESUR 15* 09/13/2015 1921   KETONESUR Trace 12/22/2013 2307   PROTEINUR NEGATIVE 09/13/2015 1921   PROTEINUR 30 mg/dL 12/22/2013 2307   NITRITE NEGATIVE 09/13/2015 1921   NITRITE Negative 09/12/2015 0915   NITRITE Negative 12/22/2013 2307   LEUKOCYTESUR NEGATIVE 09/13/2015 1921   LEUKOCYTESUR Negative 09/12/2015 0915   LEUKOCYTESUR Negative 12/22/2013 2307    Prostate Gold Seed Marker Placement Procedure   Informed consent was obtained  after discussing risks/benefits of the procedure.  A time out was performed to ensure correct patient identity.  Pre-Procedure: - Gentamicin given prophylactically - Patient took Levaquin dose this AM  Procedure: - Prostate block performed using 10 cc 1% lidocaine and biopsies taken from sextant areas, a total of 12 under ultrasound guidance. - 3 fiducial gold seed markers placed, one at right base, one at left base, one at apex of prostate gland under transrectal ultrasound guidance  Post-Procedure: - Patient tolerated the procedure well - He was counseled to seek immediate medical attention if experiences any severe pain, significant bleeding, or fevers  Cystoscopy/ Stent removal procedure  Patient identification was confirmed, informed consent was obtained, and patient was prepped using Betadine solution.  Lidocaine jelly was administered per urethral meatus.    Preoperative abx where received prior to procedure.     Procedure: - Flexible cystoscope introduced, without any difficulty.   - Thorough search of the bladder revealed:    normal urethral meatus  Stent seen emanating from left ureteral orifice, grasped with stent graspers, and removed in entirety.      Post-Procedure: - Patient tolerated the procedure well - [place]:    1 - Right Ureteral Stone - now stone free after first episode colic. Observe. Generarl dietary recs discussed.   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   3 - Bilateral Non-Complex Renal Cysts - stable and non-complex, observe  4 - RTC 23mo with PSA prior.   No Follow-up on file.  Alexis Frock, [facility] [address] [place], [place] [address] [phone]

## 2015-10-13 ENCOUNTER — Ambulatory Visit
Admission: RE | Admit: 2015-10-13 | Discharge: 2015-10-13 | Disposition: A | Discharge status: Home / Self Care | Payer: Commercial Managed Care - PPO | Source: Ambulatory Visit | Attending: Radiation Oncology | Admitting: Radiation Oncology

## 2015-10-13 DIAGNOSIS — F419 Anxiety disorder, unspecified: Secondary | ICD-10-CM | POA: Diagnosis not present

## 2015-10-13 DIAGNOSIS — Z85820 Personal history of malignant melanoma of skin: Secondary | ICD-10-CM | POA: Diagnosis not present

## 2015-10-13 DIAGNOSIS — K219 Gastro-esophageal reflux disease without esophagitis: Secondary | ICD-10-CM | POA: Diagnosis not present

## 2015-10-13 DIAGNOSIS — Z510 Encounter for antineoplastic radiation therapy: Secondary | ICD-10-CM | POA: Diagnosis not present

## 2015-10-13 DIAGNOSIS — C61 Malignant neoplasm of prostate: Secondary | ICD-10-CM | POA: Diagnosis not present

## 2015-10-13 DIAGNOSIS — Z79899 Other long term (current) drug therapy: Secondary | ICD-10-CM | POA: Diagnosis not present

## 2015-10-13 DIAGNOSIS — E785 Hyperlipidemia, unspecified: Secondary | ICD-10-CM | POA: Diagnosis not present

## 2015-10-13 DIAGNOSIS — I10 Essential (primary) hypertension: Secondary | ICD-10-CM | POA: Diagnosis not present

## 2015-10-19 DIAGNOSIS — C61 Malignant neoplasm of prostate: Secondary | ICD-10-CM | POA: Diagnosis not present

## 2015-10-21 ENCOUNTER — Other Ambulatory Visit: Payer: Self-pay | Admitting: *Deleted

## 2015-10-21 DIAGNOSIS — C61 Malignant neoplasm of prostate: Secondary | ICD-10-CM

## 2015-10-24 DIAGNOSIS — C61 Malignant neoplasm of prostate: Secondary | ICD-10-CM | POA: Diagnosis not present

## 2015-10-25 ENCOUNTER — Ambulatory Visit
Admission: RE | Admit: 2015-10-25 | Discharge: 2015-10-25 | Disposition: A | Discharge status: Home / Self Care | Payer: Commercial Managed Care - PPO | Source: Ambulatory Visit | Attending: Radiation Oncology | Admitting: Radiation Oncology

## 2015-10-26 ENCOUNTER — Ambulatory Visit
Admission: RE | Admit: 2015-10-26 | Discharge: 2015-10-26 | Disposition: A | Discharge status: Home / Self Care | Payer: Commercial Managed Care - PPO | Source: Ambulatory Visit | Attending: Radiation Oncology | Admitting: Radiation Oncology

## 2015-10-26 DIAGNOSIS — C61 Malignant neoplasm of prostate: Secondary | ICD-10-CM | POA: Diagnosis not present

## 2015-10-27 ENCOUNTER — Ambulatory Visit
Admission: RE | Admit: 2015-10-27 | Discharge: 2015-10-27 | Disposition: A | Discharge status: Home / Self Care | Payer: Commercial Managed Care - PPO | Source: Ambulatory Visit | Attending: Radiation Oncology | Admitting: Radiation Oncology

## 2015-10-27 DIAGNOSIS — C61 Malignant neoplasm of prostate: Secondary | ICD-10-CM | POA: Diagnosis not present

## 2015-10-28 ENCOUNTER — Ambulatory Visit
Admission: RE | Admit: 2015-10-28 | Discharge: 2015-10-28 | Disposition: A | Discharge status: Home / Self Care | Payer: Commercial Managed Care - PPO | Source: Ambulatory Visit | Attending: Radiation Oncology | Admitting: Radiation Oncology

## 2015-10-28 DIAGNOSIS — C61 Malignant neoplasm of prostate: Secondary | ICD-10-CM | POA: Diagnosis not present

## 2015-11-01 ENCOUNTER — Ambulatory Visit
Admission: RE | Admit: 2015-11-01 | Discharge: 2015-11-01 | Disposition: A | Discharge status: Home / Self Care | Payer: Commercial Managed Care - PPO | Source: Ambulatory Visit | Attending: Radiation Oncology | Admitting: Radiation Oncology

## 2015-11-01 DIAGNOSIS — C61 Malignant neoplasm of prostate: Secondary | ICD-10-CM | POA: Diagnosis not present

## 2015-11-01 NOTE — Discharge Summary (Signed)
  DISCHARGE SUMMARY:  DATE OF ADMISSION: 10/13/2015 DATE OF DISCHARGE 10/14/2015  ADMISSION DIAGNOSIS: Right Ureteral STone with acute renal failure and colic DISCHARGE DIAGNOSIS: Right Ureteral stone - treated  PROCEDURES: Right Ureteroscopic stone manipulaiton with insertion ureeteral stent 10/14/2015  [place] STAY:  1 - Right Ureteral Stone - Rt 34mm prox ureteral stone by ER CT at [facility] 09/03/2015 on eval right lfank pain. No prior colic episoes. NO additional Stones. Given trial of medical therapy but no interval passage and colic beocmieng refractory. Stone 32mm, SSD 15cm, 850HU. Admitted via [place] 10/13/15 for pain control, hydration, and had right ureteroscopic sotne manipulation performed 0000000 without complications.   2 - Acute Renal Failure - Baseline Cr <1.5 2015, acute rise to 2.88 by ER labs 09/13/15. Admits to poor PO intake and known right ureteral obstruction from stone. Treated with surgery and stent as per above to relive obstruction.   By 10/14/15, the day of discharge, he is ambulatory, pain controlled on oral meds, had definitive treatment for his obstructing stone, and is felt to be adequate for discharge.  DISCHARGE EXAM:  NAD, Wife at bedise NCAT Non-labored breathing on room air SNTND Resolved Rt CVAT No c/c/e AOx3  FOLLOWUP: He will follow up at [place] as scheudled for fiduciary marker placement and ureteral stent removal.  MEDS: Lisinopril-HCTZ (held at discharge untile stent removal) prilosed Tamsulosin Keflex Percocet Sennakot-S

## 2015-11-02 ENCOUNTER — Ambulatory Visit
Admission: RE | Admit: 2015-11-02 | Discharge: 2015-11-02 | Disposition: A | Discharge status: Home / Self Care | Payer: Commercial Managed Care - PPO | Source: Ambulatory Visit | Attending: Radiation Oncology | Admitting: Radiation Oncology

## 2015-11-02 DIAGNOSIS — C61 Malignant neoplasm of prostate: Secondary | ICD-10-CM | POA: Diagnosis not present

## 2015-11-03 ENCOUNTER — Ambulatory Visit
Admission: RE | Admit: 2015-11-03 | Discharge: 2015-11-03 | Disposition: A | Discharge status: Home / Self Care | Payer: Commercial Managed Care - PPO | Source: Ambulatory Visit | Attending: Radiation Oncology | Admitting: Radiation Oncology

## 2015-11-03 ENCOUNTER — Ambulatory Visit: Payer: Medicare Other | Admitting: Obstetrics and Gynecology

## 2015-11-03 DIAGNOSIS — C61 Malignant neoplasm of prostate: Secondary | ICD-10-CM | POA: Diagnosis not present

## 2015-11-04 ENCOUNTER — Ambulatory Visit
Admission: RE | Admit: 2015-11-04 | Discharge: 2015-11-04 | Disposition: A | Discharge status: Home / Self Care | Payer: Commercial Managed Care - PPO | Source: Ambulatory Visit | Attending: Radiation Oncology | Admitting: Radiation Oncology

## 2015-11-04 DIAGNOSIS — C61 Malignant neoplasm of prostate: Secondary | ICD-10-CM | POA: Diagnosis not present

## 2015-11-07 ENCOUNTER — Ambulatory Visit: Payer: Commercial Managed Care - PPO

## 2015-11-08 ENCOUNTER — Ambulatory Visit
Admission: RE | Admit: 2015-11-08 | Discharge: 2015-11-08 | Disposition: A | Discharge status: Home / Self Care | Payer: Commercial Managed Care - PPO | Source: Ambulatory Visit | Attending: Radiation Oncology | Admitting: Radiation Oncology

## 2015-11-08 DIAGNOSIS — C61 Malignant neoplasm of prostate: Secondary | ICD-10-CM | POA: Diagnosis not present

## 2015-11-09 ENCOUNTER — Ambulatory Visit
Admission: RE | Admit: 2015-11-09 | Discharge: 2015-11-09 | Disposition: A | Discharge status: Home / Self Care | Payer: Commercial Managed Care - PPO | Source: Ambulatory Visit | Attending: Radiation Oncology | Admitting: Radiation Oncology

## 2015-11-09 ENCOUNTER — Inpatient Hospital Stay: Payer: Commercial Managed Care - PPO | Attending: Radiation Oncology

## 2015-11-09 DIAGNOSIS — C61 Malignant neoplasm of prostate: Secondary | ICD-10-CM | POA: Diagnosis not present

## 2015-11-09 DIAGNOSIS — Z85820 Personal history of malignant melanoma of skin: Secondary | ICD-10-CM | POA: Insufficient documentation

## 2015-11-09 DIAGNOSIS — Z23 Encounter for immunization: Secondary | ICD-10-CM | POA: Diagnosis not present

## 2015-11-09 LAB — CBC
HCT: 42.4 % (ref 40.0–52.0)
HEMOGLOBIN: 14.6 g/dL (ref 13.0–18.0)
MCH: 31.9 pg (ref 26.0–34.0)
MCHC: 34.5 g/dL (ref 32.0–36.0)
MCV: 92.4 fL (ref 80.0–100.0)
Platelets: 224 10*3/uL (ref 150–440)
RBC: 4.59 MIL/uL (ref 4.40–5.90)
RDW: 13.9 % (ref 11.5–14.5)
WBC: 4.7 10*3/uL (ref 3.8–10.6)

## 2015-11-10 ENCOUNTER — Ambulatory Visit
Admission: RE | Admit: 2015-11-10 | Discharge: 2015-11-10 | Disposition: A | Discharge status: Home / Self Care | Payer: Commercial Managed Care - PPO | Source: Ambulatory Visit | Attending: Radiation Oncology | Admitting: Radiation Oncology

## 2015-11-10 DIAGNOSIS — C61 Malignant neoplasm of prostate: Secondary | ICD-10-CM | POA: Diagnosis not present

## 2015-11-11 ENCOUNTER — Ambulatory Visit
Admission: RE | Admit: 2015-11-11 | Discharge: 2015-11-11 | Disposition: A | Discharge status: Home / Self Care | Payer: Commercial Managed Care - PPO | Source: Ambulatory Visit | Attending: Radiation Oncology | Admitting: Radiation Oncology

## 2015-11-11 DIAGNOSIS — C61 Malignant neoplasm of prostate: Secondary | ICD-10-CM | POA: Diagnosis not present

## 2015-11-14 ENCOUNTER — Ambulatory Visit
Admission: RE | Admit: 2015-11-14 | Discharge: 2015-11-14 | Disposition: A | Discharge status: Home / Self Care | Payer: Commercial Managed Care - PPO | Source: Ambulatory Visit | Attending: Radiation Oncology | Admitting: Radiation Oncology

## 2015-11-14 DIAGNOSIS — C61 Malignant neoplasm of prostate: Secondary | ICD-10-CM | POA: Diagnosis not present

## 2015-11-15 ENCOUNTER — Ambulatory Visit
Admission: RE | Admit: 2015-11-15 | Discharge: 2015-11-15 | Disposition: A | Discharge status: Home / Self Care | Payer: Commercial Managed Care - PPO | Source: Ambulatory Visit | Attending: Radiation Oncology | Admitting: Radiation Oncology

## 2015-11-15 DIAGNOSIS — C61 Malignant neoplasm of prostate: Secondary | ICD-10-CM | POA: Diagnosis not present

## 2015-11-16 ENCOUNTER — Ambulatory Visit
Admission: RE | Admit: 2015-11-16 | Discharge: 2015-11-16 | Disposition: A | Discharge status: Home / Self Care | Payer: Commercial Managed Care - PPO | Source: Ambulatory Visit | Attending: Radiation Oncology | Admitting: Radiation Oncology

## 2015-11-16 DIAGNOSIS — C61 Malignant neoplasm of prostate: Secondary | ICD-10-CM | POA: Diagnosis not present

## 2015-11-17 ENCOUNTER — Ambulatory Visit
Admission: RE | Admit: 2015-11-17 | Discharge: 2015-11-17 | Disposition: A | Discharge status: Home / Self Care | Payer: Commercial Managed Care - PPO | Source: Ambulatory Visit | Attending: Radiation Oncology | Admitting: Radiation Oncology

## 2015-11-17 DIAGNOSIS — C61 Malignant neoplasm of prostate: Secondary | ICD-10-CM | POA: Diagnosis not present

## 2015-11-18 ENCOUNTER — Ambulatory Visit
Admission: RE | Admit: 2015-11-18 | Discharge: 2015-11-18 | Disposition: A | Discharge status: Home / Self Care | Payer: Commercial Managed Care - PPO | Source: Ambulatory Visit | Attending: Radiation Oncology | Admitting: Radiation Oncology

## 2015-11-18 DIAGNOSIS — C61 Malignant neoplasm of prostate: Secondary | ICD-10-CM | POA: Diagnosis not present

## 2015-11-21 ENCOUNTER — Ambulatory Visit
Admission: RE | Admit: 2015-11-21 | Discharge: 2015-11-21 | Disposition: A | Discharge status: Home / Self Care | Payer: Commercial Managed Care - PPO | Source: Ambulatory Visit | Attending: Radiation Oncology | Admitting: Radiation Oncology

## 2015-11-21 DIAGNOSIS — C61 Malignant neoplasm of prostate: Secondary | ICD-10-CM | POA: Diagnosis not present

## 2015-11-22 ENCOUNTER — Ambulatory Visit
Admission: RE | Admit: 2015-11-22 | Discharge: 2015-11-22 | Disposition: A | Discharge status: Home / Self Care | Payer: Commercial Managed Care - PPO | Source: Ambulatory Visit | Attending: Radiation Oncology | Admitting: Radiation Oncology

## 2015-11-22 DIAGNOSIS — C61 Malignant neoplasm of prostate: Secondary | ICD-10-CM | POA: Diagnosis not present

## 2015-11-23 ENCOUNTER — Ambulatory Visit
Admission: RE | Admit: 2015-11-23 | Discharge: 2015-11-23 | Disposition: A | Discharge status: Home / Self Care | Payer: Commercial Managed Care - PPO | Source: Ambulatory Visit | Attending: Radiation Oncology | Admitting: Radiation Oncology

## 2015-11-23 ENCOUNTER — Inpatient Hospital Stay: Payer: Commercial Managed Care - PPO

## 2015-11-23 DIAGNOSIS — C61 Malignant neoplasm of prostate: Secondary | ICD-10-CM | POA: Diagnosis not present

## 2015-11-23 DIAGNOSIS — Z85820 Personal history of malignant melanoma of skin: Secondary | ICD-10-CM | POA: Diagnosis not present

## 2015-11-23 LAB — CBC
HCT: 43.4 % (ref 40.0–52.0)
Hemoglobin: 15.3 g/dL (ref 13.0–18.0)
MCH: 32.6 pg (ref 26.0–34.0)
MCHC: 35.4 g/dL (ref 32.0–36.0)
MCV: 92.1 fL (ref 80.0–100.0)
PLATELETS: 209 10*3/uL (ref 150–440)
RBC: 4.71 MIL/uL (ref 4.40–5.90)
RDW: 14.5 % (ref 11.5–14.5)
WBC: 5.6 10*3/uL (ref 3.8–10.6)

## 2015-11-24 ENCOUNTER — Ambulatory Visit
Admission: RE | Admit: 2015-11-24 | Discharge: 2015-11-24 | Disposition: A | Discharge status: Home / Self Care | Payer: Commercial Managed Care - PPO | Source: Ambulatory Visit | Attending: Radiation Oncology | Admitting: Radiation Oncology

## 2015-11-24 DIAGNOSIS — C61 Malignant neoplasm of prostate: Secondary | ICD-10-CM | POA: Diagnosis not present

## 2015-11-25 ENCOUNTER — Ambulatory Visit
Admission: RE | Admit: 2015-11-25 | Discharge: 2015-11-25 | Disposition: A | Discharge status: Home / Self Care | Payer: Commercial Managed Care - PPO | Source: Ambulatory Visit | Attending: Radiation Oncology | Admitting: Radiation Oncology

## 2015-11-25 DIAGNOSIS — C61 Malignant neoplasm of prostate: Secondary | ICD-10-CM | POA: Diagnosis not present

## 2015-11-28 ENCOUNTER — Ambulatory Visit
Admission: RE | Admit: 2015-11-28 | Discharge: 2015-11-28 | Disposition: A | Discharge status: Home / Self Care | Payer: Commercial Managed Care - PPO | Source: Ambulatory Visit | Attending: Radiation Oncology | Admitting: Radiation Oncology

## 2015-11-28 DIAGNOSIS — C61 Malignant neoplasm of prostate: Secondary | ICD-10-CM | POA: Diagnosis not present

## 2015-11-29 ENCOUNTER — Ambulatory Visit
Admission: RE | Admit: 2015-11-29 | Discharge: 2015-11-29 | Disposition: A | Discharge status: Home / Self Care | Payer: Commercial Managed Care - PPO | Source: Ambulatory Visit | Attending: Radiation Oncology | Admitting: Radiation Oncology

## 2015-11-29 DIAGNOSIS — C61 Malignant neoplasm of prostate: Secondary | ICD-10-CM | POA: Diagnosis not present

## 2015-11-30 ENCOUNTER — Ambulatory Visit
Admission: RE | Admit: 2015-11-30 | Discharge: 2015-11-30 | Disposition: A | Discharge status: Home / Self Care | Payer: Commercial Managed Care - PPO | Source: Ambulatory Visit | Attending: Radiation Oncology | Admitting: Radiation Oncology

## 2015-11-30 DIAGNOSIS — C61 Malignant neoplasm of prostate: Secondary | ICD-10-CM | POA: Diagnosis not present

## 2015-12-01 ENCOUNTER — Ambulatory Visit
Admission: RE | Admit: 2015-12-01 | Discharge: 2015-12-01 | Disposition: A | Discharge status: Home / Self Care | Payer: Commercial Managed Care - PPO | Source: Ambulatory Visit | Attending: Radiation Oncology | Admitting: Radiation Oncology

## 2015-12-01 DIAGNOSIS — C61 Malignant neoplasm of prostate: Secondary | ICD-10-CM | POA: Diagnosis not present

## 2015-12-02 ENCOUNTER — Ambulatory Visit
Admission: RE | Admit: 2015-12-02 | Discharge: 2015-12-02 | Disposition: A | Discharge status: Home / Self Care | Payer: Commercial Managed Care - PPO | Source: Ambulatory Visit | Attending: Radiation Oncology | Admitting: Radiation Oncology

## 2015-12-02 DIAGNOSIS — C61 Malignant neoplasm of prostate: Secondary | ICD-10-CM | POA: Diagnosis not present

## 2015-12-05 ENCOUNTER — Ambulatory Visit
Admission: RE | Admit: 2015-12-05 | Discharge: 2015-12-05 | Disposition: A | Discharge status: Home / Self Care | Payer: Commercial Managed Care - PPO | Source: Ambulatory Visit | Attending: Radiation Oncology | Admitting: Radiation Oncology

## 2015-12-05 DIAGNOSIS — C61 Malignant neoplasm of prostate: Secondary | ICD-10-CM | POA: Diagnosis not present

## 2015-12-06 ENCOUNTER — Ambulatory Visit
Admission: RE | Admit: 2015-12-06 | Discharge: 2015-12-06 | Disposition: A | Discharge status: Home / Self Care | Payer: Commercial Managed Care - PPO | Source: Ambulatory Visit | Attending: Radiation Oncology | Admitting: Radiation Oncology

## 2015-12-06 DIAGNOSIS — C61 Malignant neoplasm of prostate: Secondary | ICD-10-CM | POA: Diagnosis not present

## 2015-12-07 ENCOUNTER — Inpatient Hospital Stay: Payer: Commercial Managed Care - PPO | Attending: Radiation Oncology

## 2015-12-07 ENCOUNTER — Ambulatory Visit
Admission: RE | Admit: 2015-12-07 | Discharge: 2015-12-07 | Disposition: A | Discharge status: Home / Self Care | Payer: Commercial Managed Care - PPO | Source: Ambulatory Visit | Attending: Radiation Oncology | Admitting: Radiation Oncology

## 2015-12-07 DIAGNOSIS — Z923 Personal history of irradiation: Secondary | ICD-10-CM | POA: Insufficient documentation

## 2015-12-07 DIAGNOSIS — Z85828 Personal history of other malignant neoplasm of skin: Secondary | ICD-10-CM | POA: Insufficient documentation

## 2015-12-07 DIAGNOSIS — Z85820 Personal history of malignant melanoma of skin: Secondary | ICD-10-CM | POA: Insufficient documentation

## 2015-12-07 DIAGNOSIS — C61 Malignant neoplasm of prostate: Secondary | ICD-10-CM | POA: Diagnosis not present

## 2015-12-07 LAB — CBC
HCT: 41.1 % (ref 40.0–52.0)
Hemoglobin: 14.6 g/dL (ref 13.0–18.0)
MCH: 32.8 pg (ref 26.0–34.0)
MCHC: 35.5 g/dL (ref 32.0–36.0)
MCV: 92.3 fL (ref 80.0–100.0)
PLATELETS: 185 10*3/uL (ref 150–440)
RBC: 4.45 MIL/uL (ref 4.40–5.90)
RDW: 14.1 % (ref 11.5–14.5)
WBC: 4.5 10*3/uL (ref 3.8–10.6)

## 2015-12-08 ENCOUNTER — Ambulatory Visit
Admission: RE | Admit: 2015-12-08 | Discharge: 2015-12-08 | Disposition: A | Discharge status: Home / Self Care | Payer: Commercial Managed Care - PPO | Source: Ambulatory Visit | Attending: Radiation Oncology | Admitting: Radiation Oncology

## 2015-12-08 DIAGNOSIS — C61 Malignant neoplasm of prostate: Secondary | ICD-10-CM | POA: Diagnosis not present

## 2015-12-09 ENCOUNTER — Ambulatory Visit
Admission: RE | Admit: 2015-12-09 | Discharge: 2015-12-09 | Disposition: A | Discharge status: Home / Self Care | Payer: Commercial Managed Care - PPO | Source: Ambulatory Visit | Attending: Radiation Oncology | Admitting: Radiation Oncology

## 2015-12-09 DIAGNOSIS — C61 Malignant neoplasm of prostate: Secondary | ICD-10-CM | POA: Diagnosis not present

## 2015-12-12 ENCOUNTER — Ambulatory Visit
Admission: RE | Admit: 2015-12-12 | Discharge: 2015-12-12 | Disposition: A | Discharge status: Home / Self Care | Payer: Commercial Managed Care - PPO | Source: Ambulatory Visit | Attending: Radiation Oncology | Admitting: Radiation Oncology

## 2015-12-12 DIAGNOSIS — C61 Malignant neoplasm of prostate: Secondary | ICD-10-CM | POA: Diagnosis not present

## 2015-12-13 ENCOUNTER — Ambulatory Visit
Admission: RE | Admit: 2015-12-13 | Discharge: 2015-12-13 | Disposition: A | Discharge status: Home / Self Care | Payer: Commercial Managed Care - PPO | Source: Ambulatory Visit | Attending: Radiation Oncology | Admitting: Radiation Oncology

## 2015-12-13 DIAGNOSIS — C61 Malignant neoplasm of prostate: Secondary | ICD-10-CM | POA: Diagnosis not present

## 2015-12-14 ENCOUNTER — Ambulatory Visit
Admission: RE | Admit: 2015-12-14 | Discharge: 2015-12-14 | Disposition: A | Discharge status: Home / Self Care | Payer: Commercial Managed Care - PPO | Source: Ambulatory Visit | Attending: Radiation Oncology | Admitting: Radiation Oncology

## 2015-12-14 DIAGNOSIS — C61 Malignant neoplasm of prostate: Secondary | ICD-10-CM | POA: Diagnosis not present

## 2015-12-15 ENCOUNTER — Ambulatory Visit
Admission: RE | Admit: 2015-12-15 | Discharge: 2015-12-15 | Disposition: A | Discharge status: Home / Self Care | Payer: Commercial Managed Care - PPO | Source: Ambulatory Visit | Attending: Radiation Oncology | Admitting: Radiation Oncology

## 2015-12-15 DIAGNOSIS — C61 Malignant neoplasm of prostate: Secondary | ICD-10-CM | POA: Diagnosis not present

## 2015-12-16 ENCOUNTER — Ambulatory Visit
Admission: RE | Admit: 2015-12-16 | Discharge: 2015-12-16 | Disposition: A | Discharge status: Home / Self Care | Payer: Commercial Managed Care - PPO | Source: Ambulatory Visit | Attending: Radiation Oncology | Admitting: Radiation Oncology

## 2015-12-16 DIAGNOSIS — C61 Malignant neoplasm of prostate: Secondary | ICD-10-CM | POA: Diagnosis not present

## 2015-12-19 ENCOUNTER — Ambulatory Visit
Admission: RE | Admit: 2015-12-19 | Discharge: 2015-12-19 | Disposition: A | Discharge status: Home / Self Care | Payer: Commercial Managed Care - PPO | Source: Ambulatory Visit | Attending: Radiation Oncology | Admitting: Radiation Oncology

## 2015-12-19 DIAGNOSIS — C61 Malignant neoplasm of prostate: Secondary | ICD-10-CM | POA: Diagnosis not present

## 2015-12-20 ENCOUNTER — Ambulatory Visit
Admission: RE | Admit: 2015-12-20 | Discharge: 2015-12-20 | Disposition: A | Discharge status: Home / Self Care | Payer: Commercial Managed Care - PPO | Source: Ambulatory Visit | Attending: Radiation Oncology | Admitting: Radiation Oncology

## 2015-12-20 DIAGNOSIS — C61 Malignant neoplasm of prostate: Secondary | ICD-10-CM | POA: Diagnosis not present

## 2015-12-21 ENCOUNTER — Ambulatory Visit: Payer: Commercial Managed Care - PPO

## 2015-12-21 DIAGNOSIS — C61 Malignant neoplasm of prostate: Secondary | ICD-10-CM | POA: Diagnosis not present

## 2015-12-22 ENCOUNTER — Ambulatory Visit: Payer: Commercial Managed Care - PPO

## 2015-12-22 DIAGNOSIS — C61 Malignant neoplasm of prostate: Secondary | ICD-10-CM | POA: Diagnosis not present

## 2016-01-26 ENCOUNTER — Ambulatory Visit
Admission: RE | Admit: 2016-01-26 | Discharge: 2016-01-26 | Disposition: A | Discharge status: Home / Self Care | Payer: Commercial Managed Care - PPO | Source: Ambulatory Visit | Attending: Radiation Oncology | Admitting: Radiation Oncology

## 2016-01-26 ENCOUNTER — Other Ambulatory Visit: Payer: Self-pay | Admitting: *Deleted

## 2016-01-26 ENCOUNTER — Encounter: Payer: Self-pay | Admitting: Radiation Oncology

## 2016-01-26 VITALS — BP 123/79 | HR 66 | Temp 96.2°F | Resp 18 | Wt 180.7 lb

## 2016-01-26 DIAGNOSIS — C61 Malignant neoplasm of prostate: Secondary | ICD-10-CM

## 2016-01-26 NOTE — Progress Notes (Signed)
Radiation Oncology Follow up Note  Name: Raymond Christian   Date:   01/26/2016 MRN:  [id number] DOB: 03-15-1941    This 75 y.o. 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  REFERRING PROVIDER: Sherrin Daisy, MD  HPI: Patient is a 75 year old male 1 month out from completed image guided radiation therapy for Gleason 7 adenocarcinoma of the prostate presenting the PSA of 4.3.. He is seen today in routine follow-up and is doing well specifically denies diarrhea dysuria or any other GI/GU complaints he has nocturia 1-2. Patient has had problems with urethral stones as well as acute renal failure in the past.  COMPLICATIONS OF TREATMENT: none  FOLLOW UP COMPLIANCE: keeps appointments   PHYSICAL EXAM:  BP 123/79 mmHg  Pulse 66  Temp(Src) 96.2 F (35.7 C)  Resp 18  Wt 180 lb 10.7 oz (81.95 kg) On rectal exam rectal sphincter tone is good. Prostate is smooth contracted without evidence of nodularity or mass. Sulcus is preserved bilaterally. No discrete nodularity is identified. No other rectal abnormalities are noted. Well-developed well-nourished patient in NAD. HEENT reveals PERLA, EOMI, discs not visualized.  Oral cavity is clear. No oral mucosal lesions are identified. Neck is clear without evidence of cervical or supraclavicular adenopathy. Lungs are clear to A&P. Cardiac examination is essentially unremarkable with regular rate and rhythm without murmur rub or thrill. Abdomen is benign with no organomegaly or masses noted. Motor sensory and DTR levels are equal and symmetric in the upper and lower extremities. Cranial nerves II through XII are grossly intact. Proprioception is intact. No peripheral adenopathy or edema is identified. No motor or sensory levels are noted. Crude visual fields are within normal range.  RADIOLOGY RESULTS: No  current films for review  PLAN: Present time he is doing well from a prostate standpoint. I'm please was overall progress. I've explained to him again our protocol for PSA would like to see one in about 3 months. Have scheduled follow-up at that time with blood work to be performed. Patient is doing well knows to call sooner with any concerns.  I would like to take this opportunity for allowing me to participate in the care of your patient.Armstead Peaks., MD

## 2016-02-16 ENCOUNTER — Other Ambulatory Visit: Payer: Medicare Other

## 2016-02-22 IMAGING — CT CT RENAL STONE PROTOCOL
1 of 2 series · 15 of 32 positions shown, 19 images · non-contrast
Comparison: 12/23/2013

CLINICAL DATA: Severe low right lower back pain.  Vomiting.

EXAM:
CT ABDOMEN AND PELVIS WITHOUT CONTRAST
TECHNIQUE: Multidetector CT imaging of the abdomen and pelvis was performed
following the standard protocol without IV contrast.

[Series 2: stone standard full · axial · 0.72mm/px · z∈[-428,+12]mm · 15 of 98 slices shown, 19 images]
[im 5/98  soft-tissue]
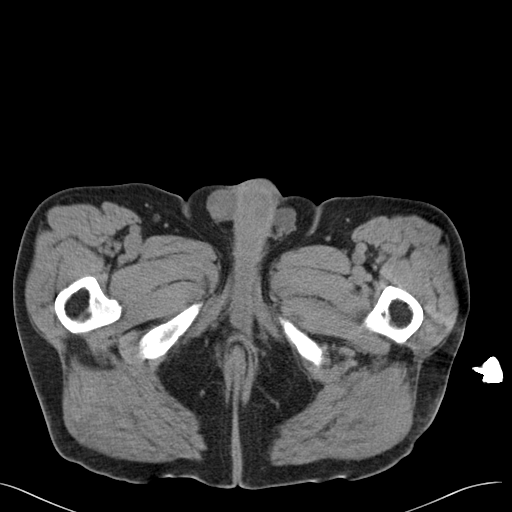
[im 5/98  bone]
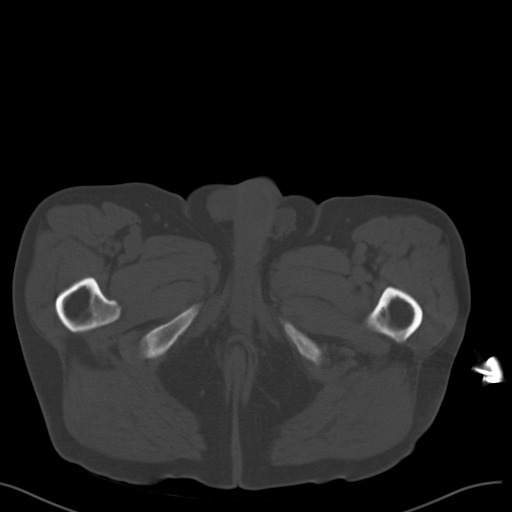
[im 13/98  soft-tissue]
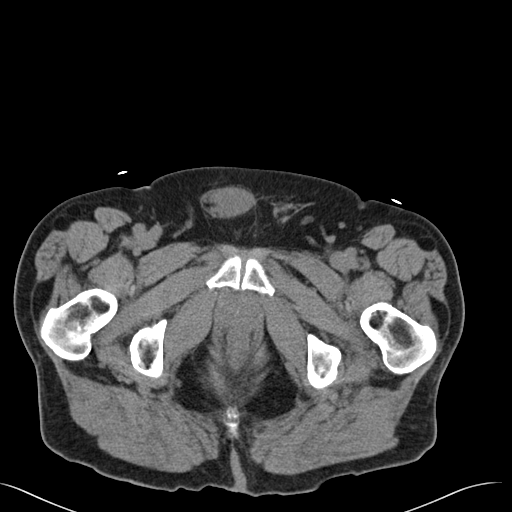
[im 21/98  soft-tissue]
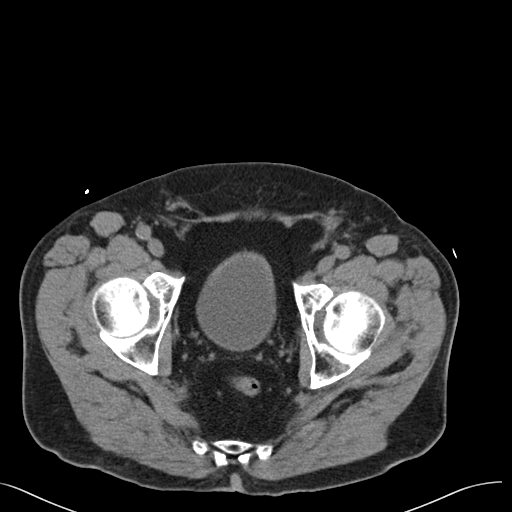
[im 29/98  soft-tissue]
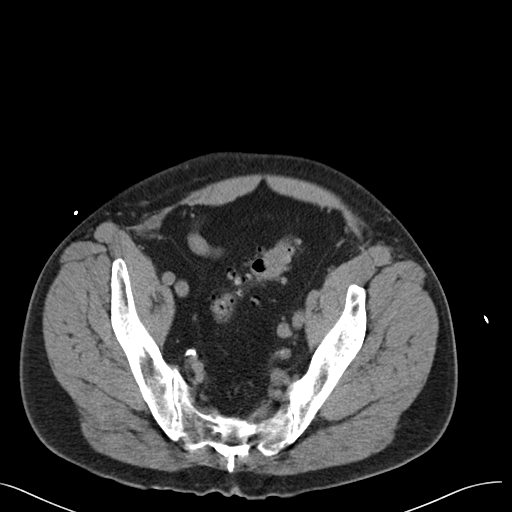
[im 33/98  soft-tissue]
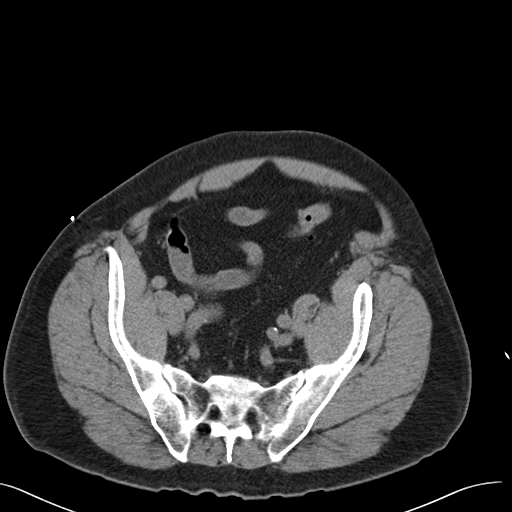
[im 41/98  soft-tissue]
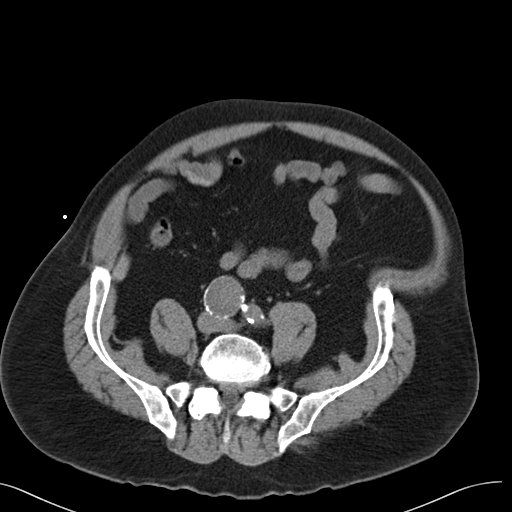
[im 49/98  soft-tissue]
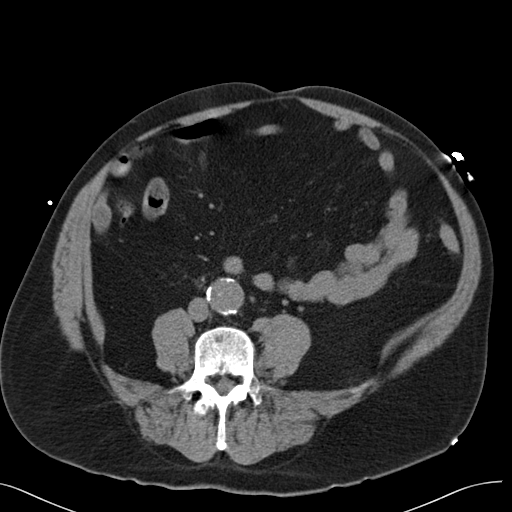
[im 57/98  soft-tissue]
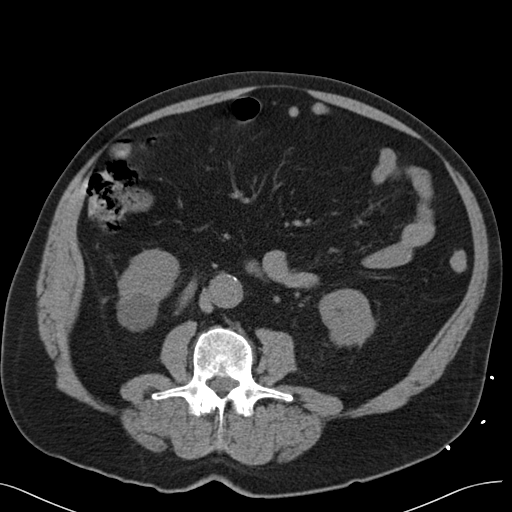
[im 65/98  soft-tissue]
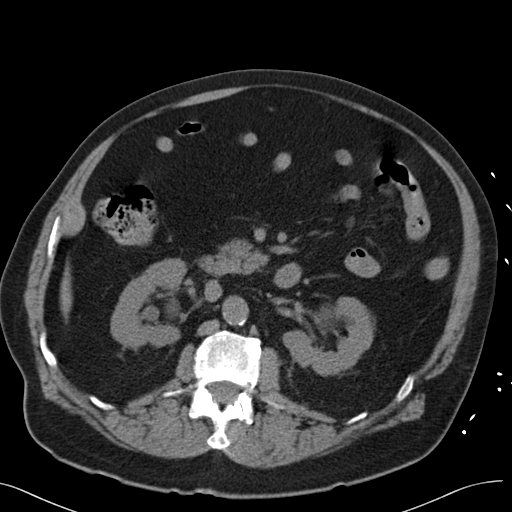
[im 65/98  bone]
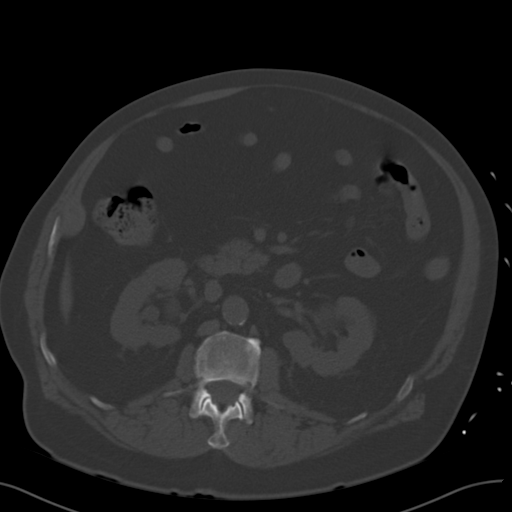
[im 69/98  soft-tissue]
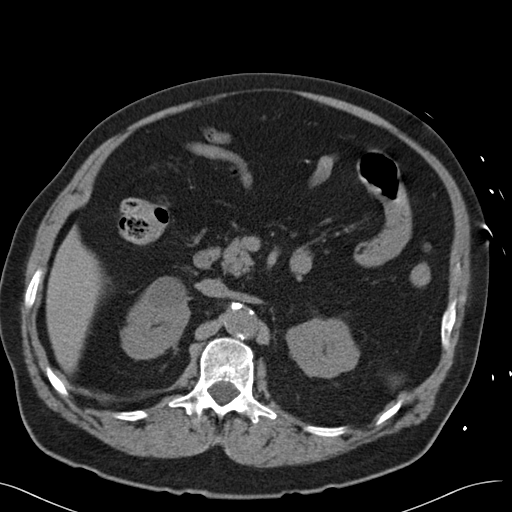
[im 77/98  soft-tissue]
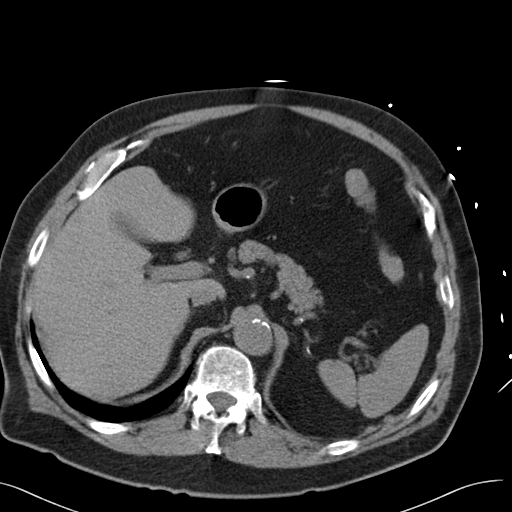
[im 81/98  lung]
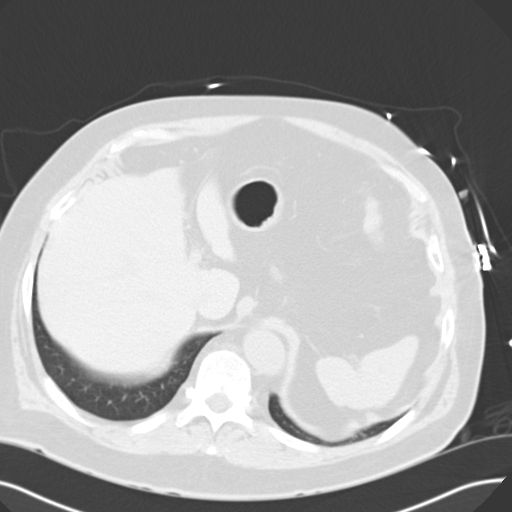
[im 85/98  soft-tissue]
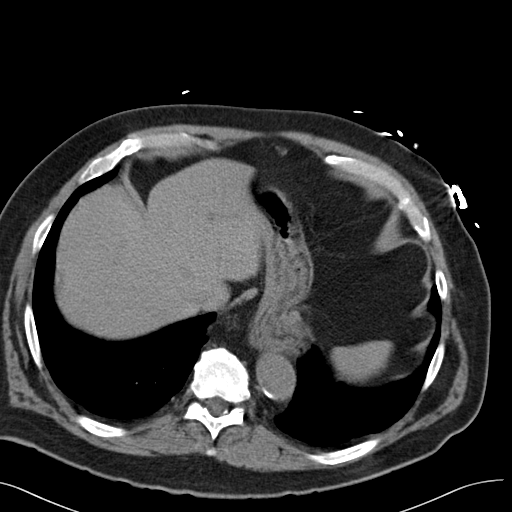
[im 85/98  lung]
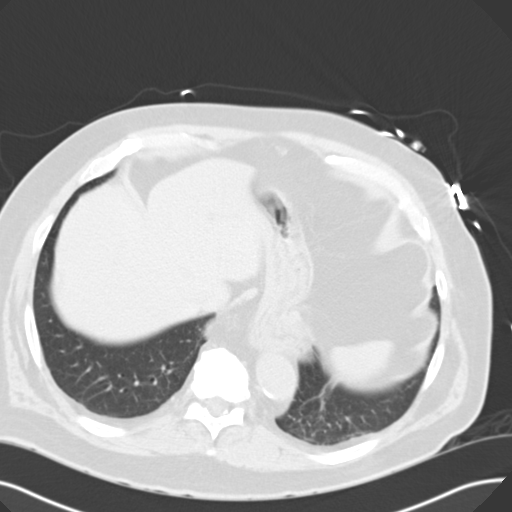
[im 89/98  lung]
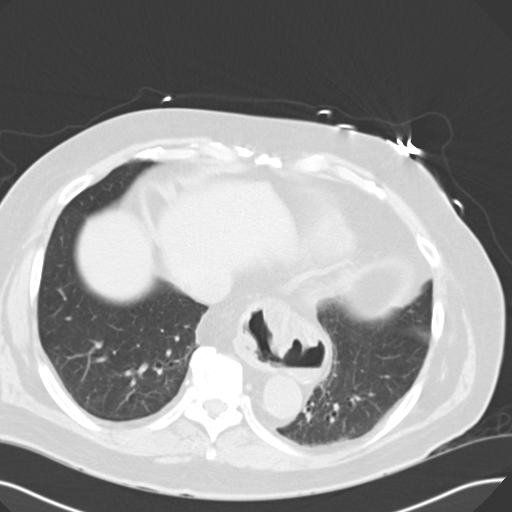
[im 93/98  soft-tissue]
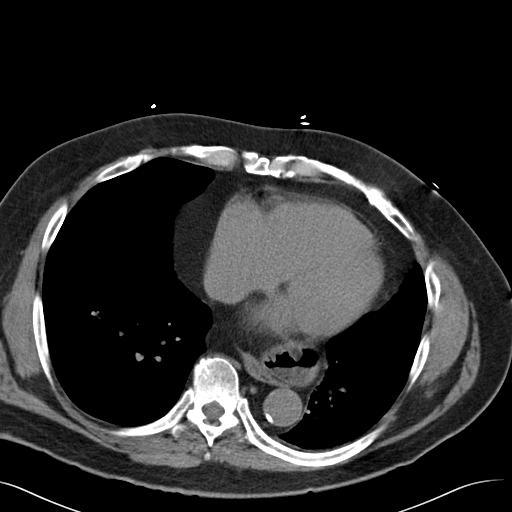
[im 93/98  lung]
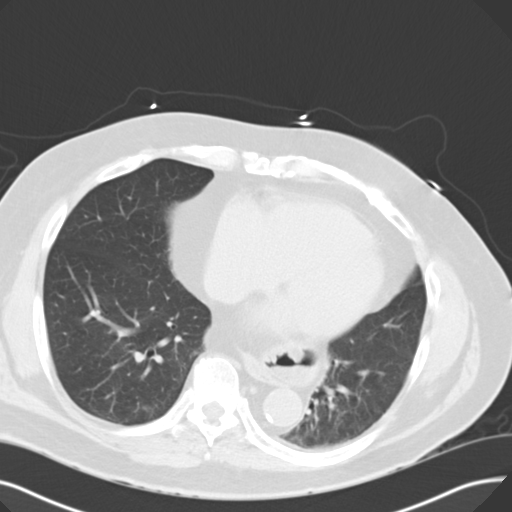

[15 of 32 positions shown; findings below may reference images not displayed]

FINDINGS: There is a 5 mm proximal right ureteral calculus at the low L3 level
with moderate hydronephrosis. Additional 2-3 mm collecting system
calculi are present elsewhere in both kidneys. Multiple cysts are
present in the right kidney, not conclusively characterized in the
absence of intravenous contrast.

There is a gallbladder calculus. There are unremarkable unenhanced
appearances of the liver and bile ducts. The spleen, pancreas and
adrenals appear unremarkable. There is a 3 cm distal abdominal
aortic aneurysm. There is a 2.8 cm right common iliac artery
aneurysm. There is no evidence of aneurysm rupture or
retroperitoneal hemorrhage.

There is a large hiatal hernia. The appendix is normal. There is
uncomplicated colonic diverticulosis.

No significant skeletal lesion. No significant abnormality in the
lower chest except for the hiatal hernia.
IMPRESSION: 1. 5 mm proximal right ureteral calculus with moderate
hydronephrosis. Bilateral nephrolithiasis.
2. Cholelithiasis.
3. Diverticulosis.
4. 3 cm abdominal aortic aneurysm. 2.8 cm right common iliac artery
aneurysm. Not significantly enlarged from 12/23/2013. No aneurysm
rupture or retroperitoneal hemorrhage.
5. Large hiatal hernia.

## 2016-02-23 ENCOUNTER — Ambulatory Visit: Payer: Medicare Other

## 2016-03-02 IMAGING — CR DG ABDOMEN 1V
1 series · 1 of 1 positions shown · non-contrast
Comparison: None.

CLINICAL DATA: Acute onset of right-sided flank pain and
constipation. Initial encounter.

EXAM:
ABDOMEN - 1 VIEW

[abdomen kub]
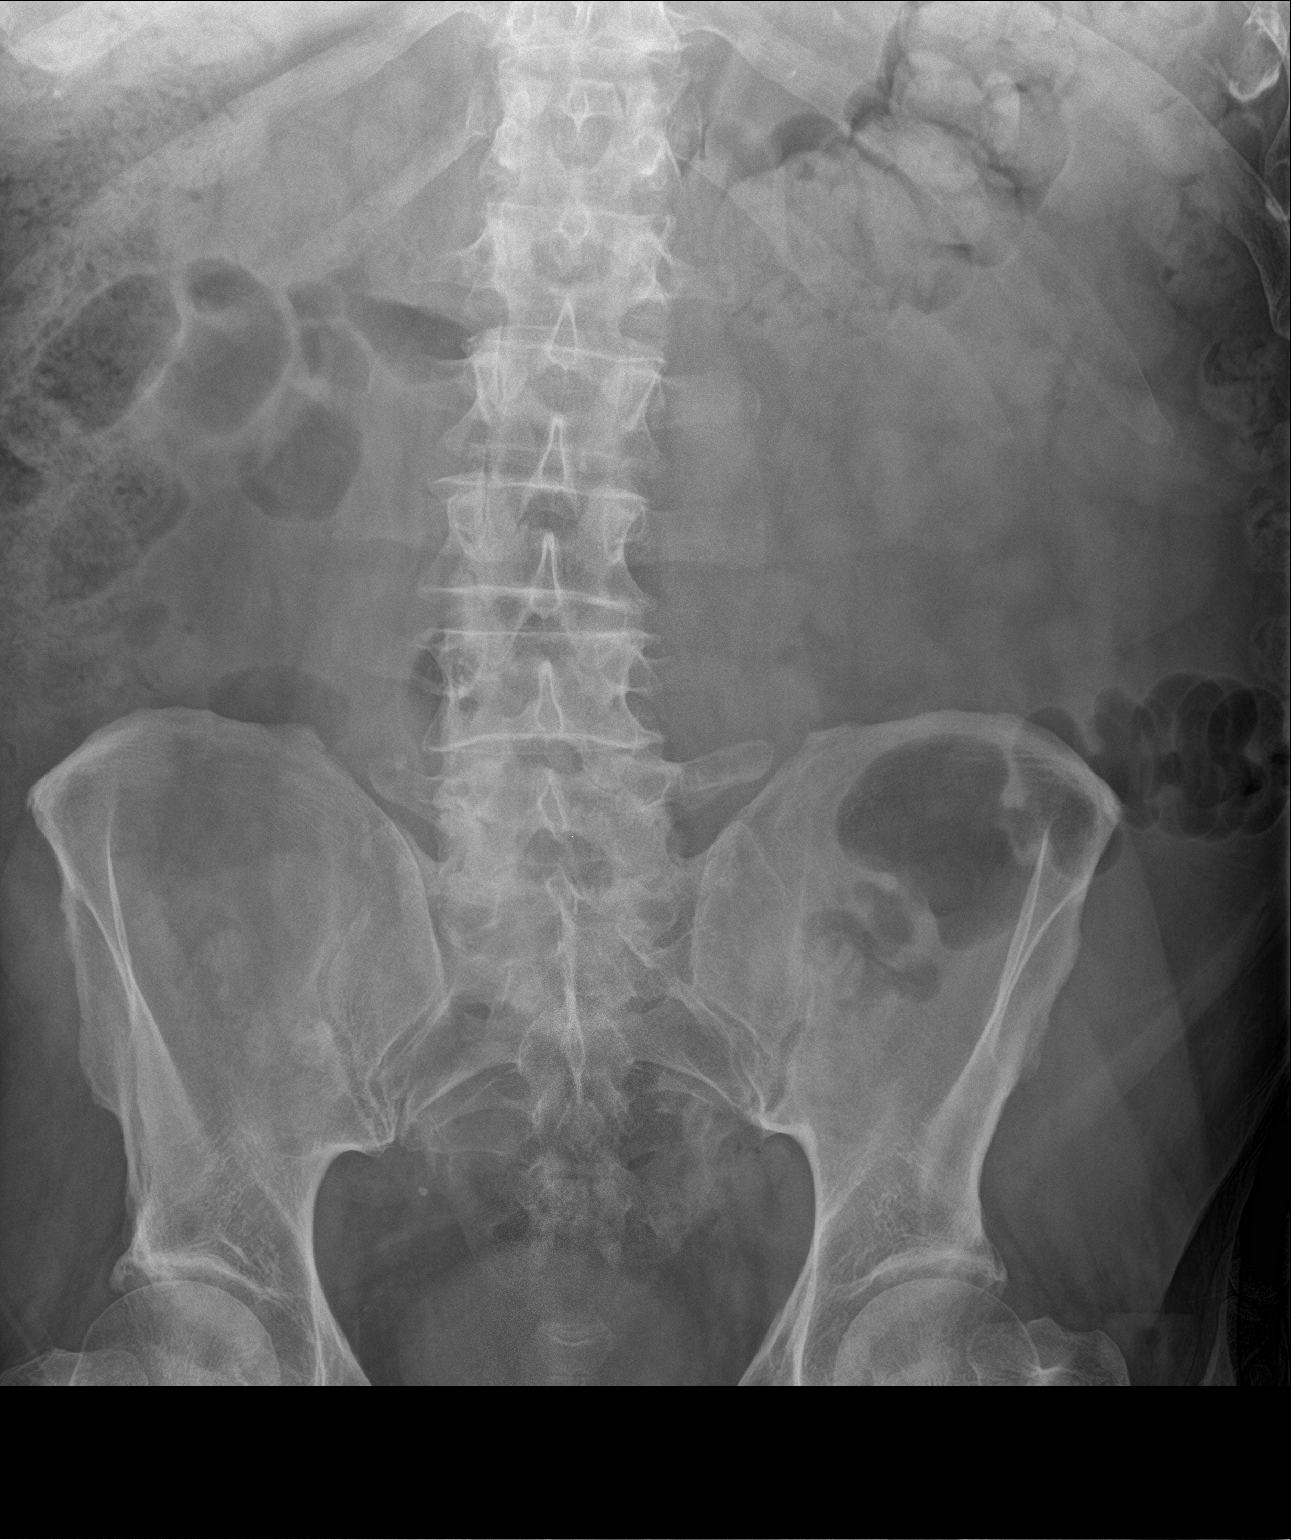

[1 of 1 positions shown; findings below may reference images not displayed]

FINDINGS: The visualized bowel gas pattern is unremarkable. Scattered air and
stool filled loops of colon are seen; no abnormal dilatation of
small bowel loops is seen to suggest small bowel obstruction. No
free intra-abdominal air is identified, though evaluation for free
air is limited on a single supine view.

The visualized osseous structures are within normal limits; the
sacroiliac joints are unremarkable in appearance. A calcification
overlying the right transverse processes of L5 is nonspecific.
IMPRESSION: Unremarkable bowel gas pattern; no free intra-abdominal air seen.
Small to moderate amount of stool noted in the colon.

## 2016-04-02 ENCOUNTER — Encounter: Payer: Self-pay | Admitting: Urology

## 2016-04-02 ENCOUNTER — Other Ambulatory Visit: Payer: Medicare Other

## 2016-04-09 ENCOUNTER — Ambulatory Visit (INDEPENDENT_AMBULATORY_CARE_PROVIDER_SITE_OTHER): Payer: Commercial Managed Care - PPO | Admitting: Urology

## 2016-04-09 ENCOUNTER — Encounter: Payer: Self-pay | Admitting: Urology

## 2016-04-09 VITALS — BP 117/89 | HR 80 | Ht 68.0 in | Wt 179.9 lb

## 2016-04-09 DIAGNOSIS — Q6100 Congenital renal cyst, unspecified: Secondary | ICD-10-CM | POA: Diagnosis not present

## 2016-04-09 DIAGNOSIS — C61 Malignant neoplasm of prostate: Secondary | ICD-10-CM | POA: Diagnosis not present

## 2016-04-09 DIAGNOSIS — N132 Hydronephrosis with renal and ureteral calculous obstruction: Secondary | ICD-10-CM | POA: Diagnosis not present

## 2016-04-09 DIAGNOSIS — N281 Cyst of kidney, acquired: Secondary | ICD-10-CM | POA: Insufficient documentation

## 2016-04-09 NOTE — Progress Notes (Signed)
10/11/2015 10:47 AM   Raymond Christian 21-Apr-1941 [id number]  Referring provider: Sherrin Daisy, MD Raymond Christian, [place] [phone]  Chief Complaint  Patient presents with  . Cysto Stent Removal    gold seed     HPI:  1 - Right Ureteral Stone - s/p right ureteroscopy to stone free 09/2015 for first episode colic from 38mm prox ureteral stone. No additional stones.   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  Post-Radiation Course: - none yet  3 - Bilateral Non-Complex Renal Cysts - Rt upper 3c, Rt lower 3cm, Lt peri-pelvic non-complex on renal imaging x several includgin contrast CT 2015, non-con CT 2016 and renal US. No nodules or enhacement.  PMH sig for melanoma, 3cm infrarenal AAA-iliac aneurysm, HTN, GERD.   Today "[name]" is seen in f/u above. He was to have PSA drawn prior to appt but this was not done.    PMH: Past Medical History  Diagnosis Date  . Essential (primary) hypertension 07/09/2014  . Gastro-esophageal reflux disease without esophagitis 07/09/2014  . Anxiety 07/09/2014  . HLD (hyperlipidemia) 07/09/2014  . Malignant melanoma ([place]) 07/09/2014  . Prostate cancer ([place]) 06/2015  . Prostate cancer [facility])     Surgical History: Past Surgical History  Procedure Laterality Date  . Hernia repair      x2  . Melanoma excision    . Cystoscopy with retrograde pyelogram, ureteroscopy and stent placement Right 09/14/2015    Procedure: [place], URETEROSCOPY AND STENT PLACEMENT;  Surgeon: Alexis Frock, MD;  [facility];  Service: Urology;  Laterality: Right;  . Holmium laser application Right [id number]    Procedure: HOLMIUM LASER APPLICATION;  Surgeon: Alexis Frock, MD;  Location: [facility];  Service: Urology;  Laterality: Right;    Home Medications:    Medication List       This list is accurate as of: 10/11/15 10:47 AM.   Always use your most recent med list.               cephALEXin 500 MG capsule  Commonly known as:  KEFLEX  Take 1 capsule (500 mg total) by mouth 3 (three) times daily. X 3 days. Begin day before next Urology appointment.     lisinopril-hydrochlorothiazide 20-12.5 MG tablet  Commonly known as:  PRINZIDE,ZESTORETIC  Take 1 tablet by mouth daily.     omeprazole 20 MG capsule  Commonly known as:  PRILOSEC  Take 20 mg by mouth every other day.     ondansetron 4 MG disintegrating tablet  Commonly known as:  ZOFRAN ODT  Take 1 tablet (4 mg total) by mouth every 8 (eight) hours as needed for nausea or vomiting.     oxyCODONE-acetaminophen 7.5-325 MG tablet  Commonly known as:  PERCOCET  TK 1 T PO Q 4 H PRF SEVERE PAIN.     senna-docusate 8.6-50 MG tablet  Commonly known as:  Senokot-S  Take 1 tablet by mouth 2 (two) times daily. While taking pain meds to prevent constipation     simvastatin 20 MG tablet  Commonly known as:  ZOCOR  Take 20 mg by mouth at bedtime.     tamsulosin 0.4 MG Caps capsule  Commonly known as:  FLOMAX  Take 1 capsule (0.4 mg total) by mouth daily.        Allergies: No Known Allergies  Family History: Family History  Problem  Relation Age of Onset  . Diabetes Sister   . Kidney disease Sister     Social History:  reports that he has never smoked. He does not have any smokeless tobacco history on file. He reports that he does not drink alcohol or use illicit drugs.  ROS: UROLOGY Frequent Urination?: No Hard to postpone urination?: No Burning/pain with urination?: No Get up at night to urinate?: No Leakage of urine?: No Urine stream starts and stops?: No Trouble starting stream?: No Do you have to strain to urinate?: No Blood in urine?: No Urinary tract infection?: No Sexually transmitted disease?: No Injury to kidneys or bladder?: No Painful intercourse?: No Weak stream?: No Erection problems?: No Penile pain?:  No  Gastrointestinal Nausea?: No Vomiting?: No Indigestion/heartburn?: No Diarrhea?: No Constipation?: No  Constitutional Fever: No Night sweats?: No Weight loss?: No Fatigue?: No  Skin Skin rash/lesions?: No Itching?: No  Eyes Blurred vision?: No Double vision?: No  Ears/Nose/Throat Sore throat?: No Sinus problems?: No  Hematologic/Lymphatic Swollen glands?: No Easy bruising?: No  Cardiovascular Leg swelling?: No Chest pain?: No  Respiratory Cough?: No Shortness of breath?: No  Endocrine Excessive thirst?: No  Musculoskeletal Back pain?: No Joint pain?: No  Neurological Headaches?: No Dizziness?: No  Psychologic Depression?: No Anxiety?: No  Physical Exam: BP 133/85 mmHg  Pulse 103  Ht 5\' 8"  (1.727 m)  Wt 79.017 kg (174 lb 3.2 oz)  BMI 26.49 kg/m2  Constitutional:  Alert and oriented, No acute distress. HEENT: [place] AT, moist mucus membranes.  Trachea midline, no masses. Cardiovascular: No clubbing, cyanosis, or edema. Respiratory: Normal respiratory effort, no increased work of breathing. GI: Abdomen is soft, nontender, nondistended, no abdominal masses GU: No CVA tenderness. Circumcised, phallus straight. Early scarrification of prostatic fossa.  Skin: No rashes, bruises or suspicious lesions. Lymph: No cervical or inguinal adenopathy. Neurologic: Grossly intact, no focal deficits, moving all 4 extremities. Psychiatric: Normal mood and affect.  Laboratory Data: Lab Results  Component Value Date   WBC 8.2 09/13/2015   HGB 14.0 09/13/2015   HCT 40.3 09/13/2015   MCV 93.7 09/13/2015   PLT 205 09/13/2015    Lab Results  Component Value Date   CREATININE 2.88* 09/13/2015    Lab Results  Component Value Date   PSA 4.3* 08/01/2015   PSA 2.8 08/06/2012   PSA 4.4* 01/23/2012    No results found for: TESTOSTERONE  Lab Results  Component Value Date   HGBA1C 6.0 12/24/2013    Urinalysis    Component Value Date/Time    COLORURINE YELLOW 09/13/2015 1921   COLORURINE Amber 12/22/2013 2307   APPEARANCEUR CLEAR 09/13/2015 1921   APPEARANCEUR Hazy 12/22/2013 2307   LABSPEC 1.024 09/13/2015 1921   LABSPEC 1.027 12/22/2013 2307   PHURINE 5.0 09/13/2015 1921   PHURINE 5.0 12/22/2013 2307   GLUCOSEU NEGATIVE 09/13/2015 1921   GLUCOSEU Negative 12/22/2013 2307   HGBUR TRACE* 09/13/2015 1921   HGBUR Negative 12/22/2013 2307   BILIRUBINUR NEGATIVE 09/13/2015 1921   BILIRUBINUR Negative 09/12/2015 0915   BILIRUBINUR Negative 12/22/2013 2307   KETONESUR 15* 09/13/2015 1921   KETONESUR Trace 12/22/2013 2307   PROTEINUR NEGATIVE 09/13/2015 1921   PROTEINUR 30 mg/dL 12/22/2013 2307   NITRITE NEGATIVE 09/13/2015 1921   NITRITE Negative 09/12/2015 0915   NITRITE Negative 12/22/2013 2307   LEUKOCYTESUR NEGATIVE 09/13/2015 1921   LEUKOCYTESUR Negative 09/12/2015 0915   LEUKOCYTESUR Negative 12/22/2013 2307    Assessment & Plan:    1 - Right Ureteral Stone -  now stone free after first episode colic. Observe.   2 - Moderate Risk Prostate Cancer- PSA today. Discussed likely slow PSA declne over approx 2 years then nadir which will serve as anchor point for future measures of success and disease activity.  3 - Bilateral Non-Complex Renal Cysts - stable and non-complex, observe  4 - PSA today and RTC 34mo with PSA prior. Rec Q64mo PSA / provider visit until nadir established / 2 years, then annualiy.     Alexis Frock, [facility] [address] [place], [place] [address] [phone]

## 2016-04-10 LAB — PSA: Prostate Specific Ag, Serum: 0.5 ng/mL (ref 0.0–4.0)

## 2016-05-09 ENCOUNTER — Telehealth: Payer: Self-pay

## 2016-05-09 NOTE — Telephone Encounter (Signed)
-----   Message from Alexis Frock, MD sent at 04/18/2016 11:07 AM EDT ----- Regarding: results PSA 0.5. This is great. Declining nicely after radiation. F/U in 62mos with PSA prior as previously planned.  Clint Lipps

## 2016-05-09 NOTE — Telephone Encounter (Signed)
The pt was notified and message forwarded to [facility] to schedule an appt for f/u 73mth w/ PSA prior.

## 2016-06-06 ENCOUNTER — Encounter: Payer: Self-pay | Admitting: Radiation Oncology

## 2016-06-06 ENCOUNTER — Inpatient Hospital Stay: Payer: Commercial Managed Care - PPO

## 2016-06-06 ENCOUNTER — Ambulatory Visit
Admission: RE | Admit: 2016-06-06 | Discharge: 2016-06-06 | Disposition: A | Discharge status: Home / Self Care | Payer: Commercial Managed Care - PPO | Source: Ambulatory Visit | Attending: Radiation Oncology | Admitting: Radiation Oncology

## 2016-06-06 VITALS — BP 130/90 | HR 82 | Temp 96.0°F | Resp 20 | Wt 179.9 lb

## 2016-06-06 DIAGNOSIS — C61 Malignant neoplasm of prostate: Secondary | ICD-10-CM

## 2016-06-06 NOTE — Progress Notes (Signed)
Radiation Oncology Follow up Note  Name: Raymond Christian   Date:   06/06/2016 MRN:  [id number] DOB: 1941/05/04    This 75 y.o. male presents to the clinic today for follow-up for stage IIa adenocarcinoma the prostate now out 5 months having completed image guided I MRT radiation therapy.  REFERRING PROVIDER: Sherrin Daisy, MD  HPI: Patient is a 75 year old male now out 5 months having completed image guided I MRT radiation therapy for a Gleason 7 (3+4) and a PSA of 4.3. He is seen today in routine follow-up and is doing well. He had a PSA performed in June which was 0.5. He specifically denies diarrhea dysuria or any other GI/GU complaints.  COMPLICATIONS OF TREATMENT: none  FOLLOW UP COMPLIANCE: keeps appointments   PHYSICAL EXAM:  BP 130/90   Pulse 82   Temp (!) 96 F (35.6 C) (Tympanic)   Resp 20   Wt 179 lb 14.3 oz (81.6 kg)   BMI 27.35 kg/m  On rectal exam rectal sphincter tone is good. Prostate is smooth contracted without evidence of nodularity or mass. Sulcus is preserved bilaterally. No discrete nodularity is identified. No other rectal abnormalities are noted. Well-developed well-nourished patient in NAD. HEENT reveals PERLA, EOMI, discs not visualized.  Oral cavity is clear. No oral mucosal lesions are identified. Neck is clear without evidence of cervical or supraclavicular adenopathy. Lungs are clear to A&P. Cardiac examination is essentially unremarkable with regular rate and rhythm without murmur rub or thrill. Abdomen is benign with no organomegaly or masses noted. Motor sensory and DTR levels are equal and symmetric in the upper and lower extremities. Cranial nerves II through XII are grossly intact. Proprioception is intact. No peripheral adenopathy or edema is identified. No motor or sensory levels are noted. Crude visual fields are within normal range.  RADIOLOGY RESULTS: No current films for review  PLAN: Present time he is doing well under good biochemical  control of his prostate cancer. I've asked to see him back in 6 months for follow-up at which time I'll repeat his PSA. Patient knows to call sooner with any concerns.  I would like to take this opportunity to thank you for allowing me to participate in the care of your patient.Armstead Peaks., MD

## 2016-10-01 ENCOUNTER — Other Ambulatory Visit: Payer: Medicare Other

## 2016-10-01 DIAGNOSIS — C61 Malignant neoplasm of prostate: Secondary | ICD-10-CM

## 2016-10-02 ENCOUNTER — Other Ambulatory Visit: Payer: Medicare Other

## 2016-10-02 LAB — PSA: PROSTATE SPECIFIC AG, SERUM: 0.2 ng/mL (ref 0.0–4.0)

## 2016-10-09 ENCOUNTER — Ambulatory Visit (INDEPENDENT_AMBULATORY_CARE_PROVIDER_SITE_OTHER): Payer: Commercial Managed Care - PPO | Admitting: Urology

## 2016-10-09 ENCOUNTER — Encounter: Payer: Self-pay | Admitting: Urology

## 2016-10-09 VITALS — BP 124/79 | HR 64 | Ht 68.0 in | Wt 182.1 lb

## 2016-10-09 DIAGNOSIS — C61 Malignant neoplasm of prostate: Secondary | ICD-10-CM | POA: Diagnosis not present

## 2016-10-09 DIAGNOSIS — N132 Hydronephrosis with renal and ureteral calculous obstruction: Secondary | ICD-10-CM | POA: Diagnosis not present

## 2016-10-09 NOTE — Progress Notes (Signed)
10/11/2015 10:47 AM   Raymond Christian [birth year] [id number]  Referring provider: Sherrin Daisy, MD [place], [place] [id number]  Chief Complaint  Patient presents with  . Cysto Stent Removal    gold seed     HPI:  1 - Right Ureteral Stone - s/p right ureteroscopy to stone free 09/2015 for first episode colic from 23mm prox ureteral stone. No additional stones.   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  Post-Radiation Course: 03/2016 PSA 0.5; 09/2016 PSA 0.2  3 - Bilateral Non-Complex Renal Cysts - Rt upper 3c, Rt lower 3cm, Lt peri-pelvic non-complex on renal imaging x several includgin contrast CT 2015, non-con CT 2016 and renal US. No nodules or enhacement.  PMH sig for melanoma, 3cm infrarenal AAA-iliac aneurysm, HTN, GERD.   Today "Raymond Christian" is seen in f/u above and PSA which shows nice down-trend to PSA as expected.     PMH: Past Medical History  Diagnosis Date  . Essential (primary) hypertension 07/09/2014  . Gastro-esophageal reflux disease without esophagitis 07/09/2014  . Anxiety 07/09/2014  . HLD (hyperlipidemia) 07/09/2014  . Malignant melanoma ([place]) 07/09/2014  . Prostate cancer ([place]) 06/2015  . Prostate cancer [facility])     Surgical History: Past Surgical History  Procedure Laterality Date  . Hernia repair      x2  . Melanoma excision    . Cystoscopy with retrograde pyelogram, ureteroscopy and stent placement Right 09/14/2015    Procedure: [place], URETEROSCOPY AND STENT PLACEMENT;  Surgeon: Alexis Frock, MD;  Location: [facility];  Service: Urology;  Laterality: Right;  . Holmium laser application Right [id number]    Procedure: HOLMIUM LASER APPLICATION;  Surgeon: Alexis Frock, MD;  Location: [facility];  Service: Urology;  Laterality: Right;    Home Medications:    Medication List       This list is accurate as of: 10/11/15  10:47 AM.  Always use your most recent med list.               cephALEXin 500 MG capsule  Commonly known as:  KEFLEX  Take 1 capsule (500 mg total) by mouth 3 (three) times daily. X 3 days. Begin day before next Urology appointment.     lisinopril-hydrochlorothiazide 20-12.5 MG tablet  Commonly known as:  PRINZIDE,ZESTORETIC  Take 1 tablet by mouth daily.     omeprazole 20 MG capsule  Commonly known as:  PRILOSEC  Take 20 mg by mouth every other day.     ondansetron 4 MG disintegrating tablet  Commonly known as:  ZOFRAN ODT  Take 1 tablet (4 mg total) by mouth every 8 (eight) hours as needed for nausea or vomiting.     oxyCODONE-acetaminophen 7.5-325 MG tablet  Commonly known as:  PERCOCET  TK 1 T PO Q 4 H PRF SEVERE PAIN.     senna-docusate 8.6-50 MG tablet  Commonly known as:  Senokot-S  Take 1 tablet by mouth 2 (two) times daily. While taking pain meds to prevent constipation     simvastatin 20 MG tablet  Commonly known as:  ZOCOR  Take 20 mg by mouth at bedtime.     tamsulosin 0.4 MG Caps capsule  Commonly known as:  FLOMAX  Take 1 capsule (0.4 mg total) by mouth daily.        Allergies: No Known Allergies  Family History: Family History  Problem  Relation Age of Onset  . Diabetes Sister   . Kidney disease Sister     Social History:  reports that he has never smoked. He does not have any smokeless tobacco history on file. He reports that he does not drink alcohol or use illicit drugs.  ROS: UROLOGY Frequent Urination?: No Hard to postpone urination?: No Burning/pain with urination?: No Get up at night to urinate?: No Leakage of urine?: No Urine stream starts and stops?: No Trouble starting stream?: No Do you have to strain to urinate?: No Blood in urine?: No Urinary tract infection?: No Sexually transmitted disease?: No Injury to kidneys or bladder?: No Painful intercourse?: No Weak stream?: No Erection problems?: No Penile pain?:  No  Gastrointestinal Nausea?: No Vomiting?: No Indigestion/heartburn?: No Diarrhea?: No Constipation?: No  Constitutional Fever: No Night sweats?: No Weight loss?: No Fatigue?: No  Skin Skin rash/lesions?: No Itching?: No  Eyes Blurred vision?: No Double vision?: No  Ears/Nose/Throat Sore throat?: No Sinus problems?: No  Hematologic/Lymphatic Swollen glands?: No Easy bruising?: No  Cardiovascular Leg swelling?: No Chest pain?: No  Respiratory Cough?: No Shortness of breath?: No  Endocrine Excessive thirst?: No  Musculoskeletal Back pain?: No Joint pain?: No  Neurological Headaches?: No Dizziness?: No  Psychologic Depression?: No Anxiety?: No  Physical Exam: BP 133/85 mmHg  Pulse 103  Ht 5\' 8"  (1.727 m)  Wt 79.017 kg (174 lb 3.2 oz)  BMI 26.49 kg/m2  Constitutional:  Alert and oriented, No acute distress. HEENT: [place] AT, moist mucus membranes.  Trachea midline, no masses. Cardiovascular: No clubbing, cyanosis, or edema. Respiratory: Normal respiratory effort, no increased work of breathing. GI: Abdomen is soft, nontender, nondistended, no abdominal masses GU: No CVA tenderness. Circumcised, phallus straight. Early scarrification of prostatic fossa.  Skin: No rashes, bruises or suspicious lesions. Lymph: No cervical or inguinal adenopathy. Neurologic: Grossly intact, no focal deficits, moving all 4 extremities. Psychiatric: Normal mood and affect.  Laboratory Data: Lab Results  Component Value Date   WBC 8.2 09/13/2015   HGB 14.0 09/13/2015   HCT 40.3 09/13/2015   MCV 93.7 09/13/2015   PLT 205 09/13/2015    Lab Results  Component Value Date   CREATININE 2.88* 09/13/2015    Lab Results  Component Value Date   PSA 4.3* 08/01/2015   PSA 2.8 08/06/2012   PSA 4.4* 01/23/2012    No results found for: TESTOSTERONE  Lab Results  Component Value Date   HGBA1C 6.0 12/24/2013    Urinalysis    Component Value Date/Time    COLORURINE YELLOW 09/13/2015 1921   COLORURINE Amber 12/22/2013 2307   APPEARANCEUR CLEAR 09/13/2015 1921   APPEARANCEUR Hazy 12/22/2013 2307   LABSPEC 1.024 09/13/2015 1921   LABSPEC 1.027 12/22/2013 2307   PHURINE 5.0 09/13/2015 1921   PHURINE 5.0 12/22/2013 2307   GLUCOSEU NEGATIVE 09/13/2015 1921   GLUCOSEU Negative 12/22/2013 2307   HGBUR TRACE* 09/13/2015 1921   HGBUR Negative 12/22/2013 2307   BILIRUBINUR NEGATIVE 09/13/2015 1921   BILIRUBINUR Negative 09/12/2015 0915   BILIRUBINUR Negative 12/22/2013 2307   KETONESUR 15* 09/13/2015 1921   KETONESUR Trace 12/22/2013 2307   PROTEINUR NEGATIVE 09/13/2015 1921   PROTEINUR 30 mg/dL 12/22/2013 2307   NITRITE NEGATIVE 09/13/2015 1921   NITRITE Negative 09/12/2015 0915   NITRITE Negative 12/22/2013 2307   LEUKOCYTESUR NEGATIVE 09/13/2015 1921   LEUKOCYTESUR Negative 09/12/2015 0915   LEUKOCYTESUR Negative 12/22/2013 2307    Assessment & Plan:    1 - Right Ureteral Stone -  now stone free after first episode colic. Observe.   2 - Moderate Risk Prostate Cancer- Excellent biochemical response to primary radiotherapy.  Discussed likely slow PSA declne over approx 2 years then nadir which will serve as anchor point for future measures of success and disease activity.  3 - Bilateral Non-Complex Renal Cysts - stable and non-complex, observe  4 -  RTC 53mo with PSA prior. Rec Q25mo PSA / provider visit until nadir established / 2 years, then annualiy.     Alexis Frock, [facility] [address], [place], [place] [address] [phone]

## 2016-12-19 ENCOUNTER — Encounter: Payer: Self-pay | Admitting: Radiation Oncology

## 2016-12-19 ENCOUNTER — Other Ambulatory Visit: Payer: Self-pay | Admitting: *Deleted

## 2016-12-19 ENCOUNTER — Inpatient Hospital Stay: Payer: Commercial Managed Care - PPO | Attending: Radiation Oncology

## 2016-12-19 ENCOUNTER — Ambulatory Visit
Admission: RE | Admit: 2016-12-19 | Discharge: 2016-12-19 | Disposition: A | Discharge status: Home / Self Care | Payer: Commercial Managed Care - PPO | Source: Ambulatory Visit | Attending: Radiation Oncology | Admitting: Radiation Oncology

## 2016-12-19 VITALS — BP 110/58 | HR 94 | Temp 96.6°F | Resp 20 | Wt 180.9 lb

## 2016-12-19 DIAGNOSIS — Z923 Personal history of irradiation: Secondary | ICD-10-CM | POA: Diagnosis not present

## 2016-12-19 DIAGNOSIS — I951 Orthostatic hypotension: Secondary | ICD-10-CM | POA: Diagnosis not present

## 2016-12-19 DIAGNOSIS — C61 Malignant neoplasm of prostate: Secondary | ICD-10-CM | POA: Diagnosis present

## 2016-12-19 LAB — PSA: PSA: 0.27 ng/mL (ref 0.00–4.00)

## 2016-12-19 NOTE — Progress Notes (Signed)
Radiation Oncology Follow up Note  Name: Raymond Christian   Date:   12/19/2016 MRN:  [id number] DOB: May 28, 1941    This 76 y.o. male presents to the clinic today for 1 year status post IM RT radiation therapy for Gleason 7 adenocarcinoma the prostate.  REFERRING PROVIDER: Sherrin Daisy, MD  HPI: Patient is a 76 year old male now out 1 year having completed radiation therapy for a Gleason 7 (3+4) presenting the PSA 4.3. He is seen today in routine follow-up is doing well PSA back in December 2017 was 0.2. He specifically denies diarrhea dysuria or any other GI/GU complaints. His been having some what appears to be orthostatic hypotension I've asked him to cut back on his blood pressure medicine. He's not see a family doctor since his retirement in quite some time and I'm asking him to make an appointment as soon as possible to keep check on his blood pressure..  COMPLICATIONS OF TREATMENT: none  FOLLOW UP COMPLIANCE: keeps appointments   PHYSICAL EXAM:  BP (!) 110/58   Pulse 94   Temp (!) 96.6 F (35.9 C)   Resp 20   Wt 180 lb 14.2 oz (82.1 kg)   SpO2 95%   BMI 27.50 kg/m  On rectal exam rectal sphincter tone is good. Prostate is smooth contracted without evidence of nodularity or mass. Sulcus is preserved bilaterally. No discrete nodularity is identified. No other rectal abnormalities are noted. Well-developed well-nourished patient in NAD. HEENT reveals PERLA, EOMI, discs not visualized.  Oral cavity is clear. No oral mucosal lesions are identified. Neck is clear without evidence of cervical or supraclavicular adenopathy. Lungs are clear to A&P. Cardiac examination is essentially unremarkable with regular rate and rhythm without murmur rub or thrill. Abdomen is benign with no organomegaly or masses noted. Motor sensory and DTR levels are equal and symmetric in the upper and lower extremities. Cranial nerves II through XII are grossly intact. Proprioception is intact. No peripheral  adenopathy or edema is identified. No motor or sensory levels are noted. Crude visual fields are within normal range.  RADIOLOGY RESULTS: No current films for review  PLAN: Present time patient is doing well I've run a PSA level on him today and will report that separately. Otherwise I'm please was overall progress. Again have emphasized the need for follow-up on his blood pressure medications. I've asked to cut his medications and every other day to see if that helps with his hypotension. Patient is to call with any concerns.  I would like to take this opportunity to thank you for allowing me to participate in the care of your patient.Armstead Peaks., MD

## 2017-01-02 DIAGNOSIS — Z7185 Encounter for immunization safety counseling: Secondary | ICD-10-CM | POA: Insufficient documentation

## 2017-01-02 DIAGNOSIS — E669 Obesity, unspecified: Secondary | ICD-10-CM | POA: Insufficient documentation

## 2017-01-02 DIAGNOSIS — E66811 Obesity, class 1: Secondary | ICD-10-CM | POA: Insufficient documentation

## 2017-02-06 ENCOUNTER — Encounter: Payer: Self-pay | Admitting: Emergency Medicine

## 2017-02-06 ENCOUNTER — Emergency Department
Admission: EM | Admit: 2017-02-06 | Discharge: 2017-02-06 | Disposition: A | Discharge status: Home / Self Care | Payer: Commercial Managed Care - PPO | Attending: Emergency Medicine | Admitting: Emergency Medicine

## 2017-02-06 DIAGNOSIS — Z79899 Other long term (current) drug therapy: Secondary | ICD-10-CM | POA: Insufficient documentation

## 2017-02-06 DIAGNOSIS — Z8546 Personal history of malignant neoplasm of prostate: Secondary | ICD-10-CM | POA: Diagnosis not present

## 2017-02-06 DIAGNOSIS — R079 Chest pain, unspecified: Secondary | ICD-10-CM | POA: Insufficient documentation

## 2017-02-06 DIAGNOSIS — I10 Essential (primary) hypertension: Secondary | ICD-10-CM | POA: Diagnosis present

## 2017-02-06 LAB — COMPREHENSIVE METABOLIC PANEL
ALBUMIN: 4.6 g/dL (ref 3.5–5.0)
ALK PHOS: 45 U/L (ref 38–126)
ALT: 43 U/L (ref 17–63)
ANION GAP: 8 (ref 5–15)
AST: 38 U/L (ref 15–41)
BILIRUBIN TOTAL: 0.9 mg/dL (ref 0.3–1.2)
BUN: 19 mg/dL (ref 6–20)
CALCIUM: 9.4 mg/dL (ref 8.9–10.3)
CO2: 28 mmol/L (ref 22–32)
Chloride: 103 mmol/L (ref 101–111)
Creatinine, Ser: 1.2 mg/dL (ref 0.61–1.24)
GFR, EST NON AFRICAN AMERICAN: 57 mL/min — AB (ref >60)
Glucose, Bld: 141 mg/dL — ABNORMAL HIGH (ref 65–99)
POTASSIUM: 3.9 mmol/L (ref 3.5–5.1)
Sodium: 139 mmol/L (ref 135–145)
TOTAL PROTEIN: 7.6 g/dL (ref 6.5–8.1)

## 2017-02-06 LAB — CBC WITH DIFFERENTIAL/PLATELET
BASOS PCT: 1 %
Basophils Absolute: 0.1 10*3/uL (ref 0–0.1)
Eosinophils Absolute: 0.1 10*3/uL (ref 0–0.7)
Eosinophils Relative: 2 %
HEMATOCRIT: 43.8 % (ref 40.0–52.0)
HEMOGLOBIN: 15.3 g/dL (ref 13.0–18.0)
LYMPHS ABS: 1.6 10*3/uL (ref 1.0–3.6)
Lymphocytes Relative: 30 %
MCH: 32.5 pg (ref 26.0–34.0)
MCHC: 35 g/dL (ref 32.0–36.0)
MCV: 93.1 fL (ref 80.0–100.0)
MONO ABS: 0.6 10*3/uL (ref 0.2–1.0)
MONOS PCT: 12 %
NEUTROS ABS: 2.8 10*3/uL (ref 1.4–6.5)
Neutrophils Relative %: 55 %
Platelets: 198 10*3/uL (ref 150–440)
RBC: 4.71 MIL/uL (ref 4.40–5.90)
RDW: 14.4 % (ref 11.5–14.5)
WBC: 5.1 10*3/uL (ref 3.8–10.6)

## 2017-02-06 LAB — TROPONIN I

## 2017-02-06 MED ORDER — AMLODIPINE BESYLATE 5 MG PO TABS
10.0000 mg | ORAL_TABLET | Freq: Once | ORAL | Status: AC
  Administered 2017-02-06: 10 mg via ORAL
  Filled 2017-02-06: qty 2

## 2017-02-06 NOTE — ED Triage Notes (Signed)
Per family he was dizzy this am and noticed his b/p was elevaetd

## 2017-02-06 NOTE — ED Provider Notes (Signed)
[facility] Provider Note   CSN: [id number] Arrival date & time: 02/06/17  4665     History   Chief Complaint Chief Complaint  Patient presents with  . Hypertension    HPI Raymond Christian is a 76 y.o. male history of hypertension, anxiety here presenting with dizziness, hypertension. Patient states that his doctor is trying to adjust his blood pressure medicines recently due to uncontrolled hypertension. Was on hydrochlorothiazide but he has been urinating very frequently so was taken off of hydrochlorothiazide a week ago and was put on losartan 50 mg daily. Over the last week, patient has intermittent dizziness and chest pressure. Patient denies any abdominal pain or vomiting or weakness or trouble speaking. His blood pressure has been ranging between 180-200 and this morning was 220/110. He took his losartan this morning and states that it did come down to 190s afterwards. He came for further evaluation.   The history is provided by the patient.    Past Medical History:  Diagnosis Date  . Anxiety 07/09/2014  . Essential (primary) hypertension 07/09/2014  . Gastro-esophageal reflux disease without esophagitis 07/09/2014  . HLD (hyperlipidemia) 07/09/2014  . Malignant melanoma ([place]) 07/09/2014  . Prostate cancer ([place]) 06/2015  . Prostate cancer [facility])     Patient Active Problem List   Diagnosis Date Noted  . Renal cyst Bilateral, non-complex 04/09/2016  . Ureteral stone with hydronephrosis 09/13/2015  . Hydronephrosis with urinary obstruction due to ureteral calculus 09/06/2015  . Prostate cancer ([place]) 09/06/2015  . Anxiety 07/09/2014  . Essential (primary) hypertension 07/09/2014  . Gastro-esophageal reflux disease without esophagitis 07/09/2014  . Hyperlipidemia, unspecified 07/09/2014  . Malignant melanoma ([place]) 07/09/2014    Past Surgical History:  Procedure Laterality Date  . CYSTOSCOPY WITH RETROGRADE PYELOGRAM, URETEROSCOPY AND STENT PLACEMENT Right  09/14/2015   Procedure: CYSTOSCOPY WITH RETROGRADE PYELOGRAM, URETEROSCOPY AND STENT PLACEMENT;  Surgeon: Alexis Frock, MD;  Location: [facility];  Service: Urology;  Laterality: Right;  . HERNIA REPAIR     x2  . HOLMIUM LASER APPLICATION Right 99/35/7017   Procedure: HOLMIUM LASER APPLICATION;  Surgeon: Alexis Frock, MD;  Location: [facility];  Service: Urology;  Laterality: Right;  . MELANOMA EXCISION         Home Medications    Prior to Admission medications   Medication Sig Start Date End Date Taking? Authorizing Provider  losartan (COZAAR) 50 MG tablet Take 50 mg by mouth daily. 01/31/17  Yes Historical Provider, MD  omeprazole (PRILOSEC) 20 MG capsule Take 20 mg by mouth every other day.  06/13/15  Yes Historical Provider, MD  simvastatin (ZOCOR) 20 MG tablet Take 20 mg by mouth at bedtime.  06/13/15  Yes Historical Provider, MD    Family History Family History  Problem Relation Age of Onset  . Diabetes Sister   . Kidney disease Sister     Social History Social History  Substance Use Topics  . Smoking status: Never Smoker  . Smokeless tobacco: Never Used  . Alcohol use No     Allergies   Patient has no known allergies.   Review of Systems Review of Systems  Cardiovascular: Positive for chest pain.  Neurological: Positive for dizziness.  All other systems reviewed and are negative.    Physical Exam Updated Vital Signs BP (!) 163/83   Pulse 62   Temp 97.8 F (36.6 C) (Oral)   Resp 18   Ht 5\' 8"  (1.727 m)   Wt 180 lb (81.6 kg)   SpO2 96%  BMI 27.37 kg/m   Physical Exam  Constitutional: He is oriented to person, place, and time.  Slightly anxious   HENT:  Head: Normocephalic.  Mouth/Throat: Oropharynx is clear and moist.  Eyes: EOM are normal. Pupils are equal, round, and reactive to light.  Neck: Normal range of motion. Neck supple.  Cardiovascular: Normal rate, regular rhythm and normal heart sounds.   Pulmonary/Chest: Effort normal and breath  sounds normal. No respiratory distress. He has no wheezes. He has no rales.  Abdominal: Soft. Bowel sounds are normal. He exhibits no distension. There is no tenderness. There is no guarding.  Musculoskeletal: Normal range of motion.  Neurological: He is alert and oriented to person, place, and time.  CN 2-12 intact. Nl strength throughout, nl finger to nose. Nl sensation throughout. Nl gait   Skin: Skin is warm.  Psychiatric: He has a normal mood and affect.  Nursing note and vitals reviewed.    ED Treatments / Results  Labs (all labs ordered are listed, but only abnormal results are displayed) Labs Reviewed  COMPREHENSIVE METABOLIC PANEL - Abnormal; Notable for the following:       Result Value   Glucose, Bld 141 (*)    GFR calc non Af Amer 57 (*)    All other components within normal limits  CBC WITH DIFFERENTIAL/PLATELET  TROPONIN I  TROPONIN I    EKG  EKG Interpretation None      ED ECG REPORT I, Wandra Arthurs, the attending physician, personally viewed and interpreted this ECG.   Date: 02/06/2017  EKG Time: 8:16 am  Rate: 60  Rhythm: normal EKG, normal sinus rhythm  Axis: normal  Intervals:none  ST&T Change: nonspecific    Radiology No results found.  Procedures Procedures (including critical care time)  Medications Ordered in ED Medications  amLODipine (NORVASC) tablet 10 mg (10 mg Oral Given 02/06/17 0830)     Initial Impression / Assessment and Plan / ED Course  I have reviewed the triage vital signs and the nursing notes.  Pertinent labs & imaging results that were available during my care of the patient were reviewed by me and considered in my medical decision making (see chart for details).    Raymond Christian is a 76 y.o. male here with dizziness and intermittent chest pain. Likely symptomatic hypertension. Nl neuro exam, I doubt head bleed. Will check labs, EKG, trop. He didn't tolerate HCTZ and is only on losartan. HR in the 60s and BP 190/91  on arrival. Will likely need second agent and will try Norvasc.   11:54 AM Trop neg x 2. Labs unremarkable. Called PCP, Dr. Netty Starring, who recommend double losartan to 100 mg daily and he will see patient next week.   Final Clinical Impressions(s) / ED Diagnoses   Final diagnoses:  None    New Prescriptions New Prescriptions   No medications on file     Drenda Freeze, MD 02/06/17 1155

## 2017-02-06 NOTE — Discharge Instructions (Signed)
Double losartan to 100 mg daily.   See your doctor next week to recheck blood pressure.   Return to ER if you have worse chest pain, dizziness, vomiting, abdominal pain.

## 2017-02-11 DIAGNOSIS — Z66 Do not resuscitate: Secondary | ICD-10-CM | POA: Insufficient documentation

## 2017-03-19 ENCOUNTER — Emergency Department
Admission: EM | Admit: 2017-03-19 | Discharge: 2017-03-19 | Disposition: A | Discharge status: Home / Self Care | Payer: Commercial Managed Care - PPO | Attending: Emergency Medicine | Admitting: Emergency Medicine

## 2017-03-19 ENCOUNTER — Encounter: Payer: Self-pay | Admitting: Emergency Medicine

## 2017-03-19 DIAGNOSIS — Z8546 Personal history of malignant neoplasm of prostate: Secondary | ICD-10-CM | POA: Diagnosis not present

## 2017-03-19 DIAGNOSIS — R600 Localized edema: Secondary | ICD-10-CM | POA: Diagnosis present

## 2017-03-19 DIAGNOSIS — R609 Edema, unspecified: Secondary | ICD-10-CM

## 2017-03-19 DIAGNOSIS — I10 Essential (primary) hypertension: Secondary | ICD-10-CM | POA: Insufficient documentation

## 2017-03-19 NOTE — Discharge Instructions (Signed)
Please seek medical attention for any high fevers, chest pain, shortness of breath, change in behavior, persistent vomiting, bloody stool or any other new or concerning symptoms.  

## 2017-03-19 NOTE — ED Provider Notes (Signed)
[facility] Emergency Department Provider Note   ____________________________________________   I have reviewed the triage vital signs and the nursing notes.   HISTORY  Chief Complaint Ankle swelling   History limited by: Not Limited   HPI Raymond Christian is a 76 y.o. male who presents to the emergency department today because of concern for bilateral ankle swelling. Patient states it has been going on for three days. There is no associated pain. Does not recall any trauma, unusual activity level or increased salt intake recently. States that he also noticed that his heart rate was high and that his blood pressure was high today, but feels like those issues had resolved. The patient does have an appointment scheduled with his primary care doctor for tomorrow.   Past Medical History:  Diagnosis Date  . Anxiety 07/09/2014  . Essential (primary) hypertension 07/09/2014  . Gastro-esophageal reflux disease without esophagitis 07/09/2014  . HLD (hyperlipidemia) 07/09/2014  . Malignant melanoma ([place]) 07/09/2014  . Prostate cancer ([place]) 06/2015  . Prostate cancer [facility])     Patient Active Problem List   Diagnosis Date Noted  . Renal cyst Bilateral, non-complex 04/09/2016  . Ureteral stone with hydronephrosis 09/13/2015  . Hydronephrosis with urinary obstruction due to ureteral calculus 09/06/2015  . Prostate cancer ([place]) 09/06/2015  . Anxiety 07/09/2014  . Essential (primary) hypertension 07/09/2014  . Gastro-esophageal reflux disease without esophagitis 07/09/2014  . Hyperlipidemia, unspecified 07/09/2014  . Malignant melanoma ([place]) 07/09/2014    Past Surgical History:  Procedure Laterality Date  . CYSTOSCOPY WITH RETROGRADE PYELOGRAM, URETEROSCOPY AND STENT PLACEMENT Right 09/14/2015   Procedure: CYSTOSCOPY WITH RETROGRADE PYELOGRAM, URETEROSCOPY AND STENT PLACEMENT;  Surgeon: Alexis Frock, MD;  Location: [facility];  Service: Urology;  Laterality: Right;  .  HERNIA REPAIR     x2  . HOLMIUM LASER APPLICATION Right 65/12/5463   Procedure: HOLMIUM LASER APPLICATION;  Surgeon: Alexis Frock, MD;  Location: [facility];  Service: Urology;  Laterality: Right;  . MELANOMA EXCISION      Prior to Admission medications   Medication Sig Start Date End Date Taking? Authorizing Provider  losartan (COZAAR) 50 MG tablet Take 50 mg by mouth daily. 01/31/17   [provider]  omeprazole (PRILOSEC) 20 MG capsule Take 20 mg by mouth every other day.  06/13/15   [provider]  simvastatin (ZOCOR) 20 MG tablet Take 20 mg by mouth at bedtime.  06/13/15   [provider]    Allergies Patient has no known allergies.  Family History  Problem Relation Age of Onset  . Diabetes Sister   . Kidney disease Sister     Social History Social History  Substance Use Topics  . Smoking status: Never Smoker  . Smokeless tobacco: Never Used  . Alcohol use No    Review of Systems Constitutional: No fever/chills Eyes: No visual changes. ENT: No sore throat. Cardiovascular: Denies chest pain. Respiratory: Denies shortness of breath. Gastrointestinal: No abdominal pain.  No nausea, no vomiting.  No diarrhea.   Genitourinary: Negative for dysuria. Musculoskeletal: Positive for bilateral ankle swelling.  Skin: Negative for rash. Neurological: Negative for headaches, focal weakness or numbness.  ____________________________________________   PHYSICAL EXAM:  VITAL SIGNS: ED Triage Vitals  Enc Vitals Group     BP 03/19/17 1552 (!) 152/81     Pulse Rate 03/19/17 1552 91     Resp 03/19/17 1552 16     Temp 03/19/17 1552 97.8 F (36.6 C)     Temp  Source 03/19/17 1552 Oral     SpO2 --      Weight 03/19/17 1553 180 lb (81.6 kg)     Height 03/19/17 1553 5\' 8"  (1.727 m)     Head Circumference --      Peak Flow --      Pain Score 03/19/17 1553 0   Constitutional: Alert and oriented. Well appearing and in no distress. Eyes: Conjunctivae are  normal.  ENT   Head: Normocephalic and atraumatic.   Nose: No congestion/rhinnorhea.   Mouth/Throat: Mucous membranes are moist.   Neck: No stridor. Hematological/Lymphatic/Immunilogical: No cervical lymphadenopathy. Cardiovascular: Normal rate, regular rhythm.  No murmurs, rubs, or gallops.  Respiratory: Normal respiratory effort without tachypnea nor retractions. Breath sounds are clear and equal bilaterally. No wheezes/rales/rhonchi. Gastrointestinal: Soft and non tender. No rebound. No guarding.  Genitourinary: Deferred Musculoskeletal: Normal range of motion in all extremities. No lower extremity edema. Neurologic:  Normal speech and language. No gross focal neurologic deficits are appreciated.  Skin:  Skin is warm, dry and intact. No rash noted. Psychiatric: Mood and affect are normal. Speech and behavior are normal. Patient exhibits appropriate insight and judgment.  ____________________________________________    LABS (pertinent positives/negatives)  None  ____________________________________________   EKG  None  ____________________________________________    RADIOLOGY  None   ____________________________________________   PROCEDURES  Procedures  ____________________________________________   INITIAL IMPRESSION / ASSESSMENT AND PLAN / ED COURSE  Pertinent labs & imaging results that were available during my care of the patient were reviewed by me and considered in my medical decision making (see chart for details).  Patient presented to the emergency department today with concerns for bilateral ankle swelling and secondary concerns of elevated heart rate and blood pressure. Patient's heart within normal limits and blood pressure minimally elevated. The patient's ankle is did not appear swollen to me. This point I did discuss that we could do blood work in the emergency department however he does have appointment already scheduled with his  primary care doctor for tomorrow. He does feel comfortable following up with his primary care doctor tomorrow.  ____________________________________________   FINAL CLINICAL IMPRESSION(S) / ED DIAGNOSES  Final diagnoses:  Peripheral edema     Note: This dictation was prepared with Dragon dictation. Any transcriptional errors that result from this process are unintentional     Nance Pear, MD 03/19/17 1732

## 2017-03-19 NOTE — ED Triage Notes (Signed)
Had appointment with PCP, Dr. Carlean Jews, this morning.  Patietn was not able to keep that appointment and has appointment with dr. L in the morning.  Patient arrives with c/o swelling to feet and ankles x 3 days and today pulse was 106.

## 2017-03-28 ENCOUNTER — Other Ambulatory Visit: Payer: Self-pay

## 2017-03-28 DIAGNOSIS — C61 Malignant neoplasm of prostate: Secondary | ICD-10-CM

## 2017-04-05 ENCOUNTER — Telehealth: Payer: Self-pay | Admitting: Urology

## 2017-04-05 ENCOUNTER — Other Ambulatory Visit: Payer: Medicare Other

## 2017-04-05 NOTE — Telephone Encounter (Signed)
Patient came in today to get his PSA drawn for an upcoming app. He stated that he is being followed by Dr. Albina Billet at the cancer center and he drew his PSA already. We checked his chart and he had his PSA drawn last on 12-19-16. We advised the patient that you also wanted him to follow up for some cyst and kidney stones but the patient stated that he was fine and that he feels that he is being seen for the same things here and the cancer center and that he is maybe being seen to often. He was wanting to know if he should maybe be seen once a year or if he should just stay with Dr. Albina Billet for all of his appts? Please advise if we can push his follow up with you out for 1 year? Or what you think is best for this patient.  Thanks, Sharyn Lull

## 2017-04-09 ENCOUNTER — Ambulatory Visit: Payer: Medicare Other

## 2017-09-02 DIAGNOSIS — Z0000 Encounter for general adult medical examination without abnormal findings: Secondary | ICD-10-CM | POA: Insufficient documentation

## 2017-12-18 ENCOUNTER — Ambulatory Visit
Admission: RE | Admit: 2017-12-18 | Discharge: 2017-12-18 | Disposition: A | Discharge status: Home / Self Care | Payer: Commercial Managed Care - PPO | Source: Ambulatory Visit | Attending: Radiation Oncology | Admitting: Radiation Oncology

## 2017-12-18 ENCOUNTER — Encounter: Payer: Self-pay | Admitting: Radiation Oncology

## 2017-12-18 ENCOUNTER — Other Ambulatory Visit: Payer: Self-pay

## 2017-12-18 ENCOUNTER — Other Ambulatory Visit: Payer: Self-pay | Admitting: *Deleted

## 2017-12-18 ENCOUNTER — Other Ambulatory Visit: Payer: Commercial Managed Care - PPO

## 2017-12-18 VITALS — BP 153/85 | HR 77 | Temp 96.5°F | Resp 20 | Wt 182.3 lb

## 2017-12-18 DIAGNOSIS — C61 Malignant neoplasm of prostate: Secondary | ICD-10-CM | POA: Diagnosis present

## 2017-12-18 DIAGNOSIS — Z923 Personal history of irradiation: Secondary | ICD-10-CM | POA: Diagnosis not present

## 2017-12-18 NOTE — Progress Notes (Signed)
Radiation Oncology Follow up Note  Name: Raymond Christian   Date:   12/18/2017 MRN:  [id number] DOB: 20-Jun-1941    This 77 y.o. male presents to the clinic today for a 2 year follow-up status post I MRT radiation therapy for Gleason 7 adenocarcinoma the prostate.Raymond Christian  REFERRING PROVIDER: Sherrin Daisy, MD  HPI: Patient is a 76 year old male now out 2 years having completed IM RT radiation therapy for Gleason 7 (3+4) adenocarcinoma the prostate presenting the PSA 4.3. He is seen today in routine follow-up and is doing well. He specifically denies diarrhea dysuria or any other GI/GU complaints his last PSA was back in February 2018 was 0.27.Raymond Christian He had a PSA performed today which is pending.  COMPLICATIONS OF TREATMENT: none  FOLLOW UP COMPLIANCE: keeps appointments   PHYSICAL EXAM:  BP (!) 153/85   Pulse 77   Temp (!) 96.5 F (35.8 C)   Resp 20   Wt 182 lb 5.1 oz (82.7 kg)   BMI 27.72 kg/m  On rectal exam rectal sphincter tone is good. Prostate is smooth contracted without evidence of nodularity or mass. Sulcus is preserved bilaterally. No discrete nodularity is identified. No other rectal abnormalities are noted. Well-developed well-nourished patient in NAD. HEENT reveals PERLA, EOMI, discs not visualized.  Oral cavity is clear. No oral mucosal lesions are identified. Neck is clear without evidence of cervical or supraclavicular adenopathy. Lungs are clear to A&P. Cardiac examination is essentially unremarkable with regular rate and rhythm without murmur rub or thrill. Abdomen is benign with no organomegaly or masses noted. Motor sensory and DTR levels are equal and symmetric in the upper and lower extremities. Cranial nerves II through XII are grossly intact. Proprioception is intact. No peripheral adenopathy or edema is identified. No motor or sensory levels are noted. Crude visual fields are within normal range.  RADIOLOGY RESULTS: No current films for review  PLAN: We will check his PSA  performed today. Does appear to be under but good biochemical control of his disease. I'm please was overall progress. I've asked to see him back in 1 year for follow-up with a PSA prior to his visit. Patient knows to call with any concerns.  I would like to take this opportunity to thank you for allowing me to participate in the care of your patient.Noreene Filbert, MD

## 2017-12-19 LAB — PSA: PROSTATIC SPECIFIC ANTIGEN: 0.11 ng/mL (ref 0.00–4.00)

## 2018-12-10 ENCOUNTER — Inpatient Hospital Stay: Payer: Commercial Managed Care - PPO | Attending: Radiation Oncology

## 2018-12-10 DIAGNOSIS — C61 Malignant neoplasm of prostate: Secondary | ICD-10-CM | POA: Diagnosis present

## 2018-12-10 LAB — PSA: Prostatic Specific Antigen: 0.05 ng/mL (ref 0.00–4.00)

## 2018-12-17 ENCOUNTER — Encounter: Payer: Self-pay | Admitting: Radiation Oncology

## 2018-12-17 ENCOUNTER — Other Ambulatory Visit: Payer: Self-pay | Admitting: *Deleted

## 2018-12-17 ENCOUNTER — Other Ambulatory Visit: Payer: Self-pay

## 2018-12-17 ENCOUNTER — Ambulatory Visit
Admission: RE | Admit: 2018-12-17 | Discharge: 2018-12-17 | Disposition: A | Discharge status: Home / Self Care | Payer: Commercial Managed Care - PPO | Source: Ambulatory Visit | Attending: Radiation Oncology | Admitting: Radiation Oncology

## 2018-12-17 VITALS — BP 150/85 | HR 87 | Temp 97.0°F | Resp 18 | Wt 181.3 lb

## 2018-12-17 DIAGNOSIS — Z923 Personal history of irradiation: Secondary | ICD-10-CM | POA: Insufficient documentation

## 2018-12-17 DIAGNOSIS — C61 Malignant neoplasm of prostate: Secondary | ICD-10-CM | POA: Insufficient documentation

## 2018-12-17 MED ORDER — TAMSULOSIN HCL 0.4 MG PO CAPS: 0.4000 mg | ORAL_CAPSULE | Freq: Every day | ORAL | 12 refills | Status: DC

## 2018-12-17 NOTE — Progress Notes (Signed)
Radiation Oncology Follow up Note  Name: Raymond Christian   Date:   12/17/2018 MRN:  [id number] DOB: [birth year]    This 78 y.o. male presents to the clinic today for 3 year follow-up status post IM RT radiation therapy for Gleason 7 adenocarcinoma the prostate.  REFERRING PROVIDER: Dion Body, MD  HPI: patient is a 78 year old male now out 3 years having completed IM RT radiation therapy to his prostate Gleason 7 (3+4) adenocarcinoma the prostate presenting with a PSA of 4.3. Seen today in routine follow-up he is doing fairly well. His major complaint has to do with poor stream and some urgency and frequency. He did try Flomax several years prior is not been on it recently. He specifically denies diarrhea. His most recent PSA is 0.05.[name]  COMPLICATIONS OF TREATMENT: none  FOLLOW UP COMPLIANCE: keeps appointments   PHYSICAL EXAM:  BP (!) 150/85   Pulse 87   Temp (!) 97 F (36.1 C)   Resp 18   Wt 181 lb 5.3 oz (82.2 kg)   BMI 27.57 kg/m  Well-developed well-nourished patient in NAD. HEENT reveals PERLA, EOMI, discs not visualized.  Oral cavity is clear. No oral mucosal lesions are identified. Neck is clear without evidence of cervical or supraclavicular adenopathy. Lungs are clear to A&P. Cardiac examination is essentially unremarkable with regular rate and rhythm without murmur rub or thrill. Abdomen is benign with no organomegaly or masses noted. Motor sensory and DTR levels are equal and symmetric in the upper and lower extremities. Cranial nerves II through XII are grossly intact. Proprioception is intact. No peripheral adenopathy or edema is identified. No motor or sensory levels are noted. Crude visual fields are within normal range.  RADIOLOGY RESULTS: no current films for review  PLAN: present time patient is doing well under excellent biochemical control of his prostate cancer 3 years out. I'm starting on Flomax in the evening and will try that for a month. Otherwise I'm  please was overall progress. I've asked to see him back in 1 year for follow-up with a PSA prior to that visit. Patient is to call anytime with any concerns.  I would like to take this opportunity to thank you for allowing me to participate in the care of your patient.Noreene Filbert, MD

## 2018-12-30 ENCOUNTER — Emergency Department: Payer: Commercial Managed Care - PPO

## 2018-12-30 ENCOUNTER — Encounter: Payer: Self-pay | Admitting: Emergency Medicine

## 2018-12-30 ENCOUNTER — Emergency Department
Admission: EM | Admit: 2018-12-30 | Discharge: 2018-12-30 | Disposition: A | Discharge status: Home / Self Care | Payer: Commercial Managed Care - PPO | Attending: Emergency Medicine | Admitting: Emergency Medicine

## 2018-12-30 DIAGNOSIS — Z85828 Personal history of other malignant neoplasm of skin: Secondary | ICD-10-CM | POA: Insufficient documentation

## 2018-12-30 DIAGNOSIS — Z8546 Personal history of malignant neoplasm of prostate: Secondary | ICD-10-CM | POA: Insufficient documentation

## 2018-12-30 DIAGNOSIS — F419 Anxiety disorder, unspecified: Secondary | ICD-10-CM | POA: Insufficient documentation

## 2018-12-30 DIAGNOSIS — R002 Palpitations: Secondary | ICD-10-CM | POA: Insufficient documentation

## 2018-12-30 DIAGNOSIS — I10 Essential (primary) hypertension: Secondary | ICD-10-CM | POA: Insufficient documentation

## 2018-12-30 DIAGNOSIS — Z79899 Other long term (current) drug therapy: Secondary | ICD-10-CM | POA: Diagnosis not present

## 2018-12-30 DIAGNOSIS — R059 Cough, unspecified: Secondary | ICD-10-CM

## 2018-12-30 DIAGNOSIS — R05 Cough: Secondary | ICD-10-CM | POA: Diagnosis present

## 2018-12-30 LAB — CBC WITH DIFFERENTIAL/PLATELET
ABS IMMATURE GRANULOCYTES: 0.02 10*3/uL (ref 0.00–0.07)
BASOS ABS: 0 10*3/uL (ref 0.0–0.1)
Basophils Relative: 1 %
Eosinophils Absolute: 0 10*3/uL (ref 0.0–0.5)
Eosinophils Relative: 1 %
HCT: 41.2 % (ref 39.0–52.0)
HEMOGLOBIN: 14 g/dL (ref 13.0–17.0)
IMMATURE GRANULOCYTES: 1 %
LYMPHS PCT: 17 %
Lymphs Abs: 0.7 10*3/uL (ref 0.7–4.0)
MCH: 31.8 pg (ref 26.0–34.0)
MCHC: 34 g/dL (ref 30.0–36.0)
MCV: 93.6 fL (ref 80.0–100.0)
MONO ABS: 0.7 10*3/uL (ref 0.1–1.0)
Monocytes Relative: 17 %
NEUTROS ABS: 2.5 10*3/uL (ref 1.7–7.7)
NRBC: 0 % (ref 0.0–0.2)
Neutrophils Relative %: 63 %
Platelets: 192 10*3/uL (ref 150–400)
RBC: 4.4 MIL/uL (ref 4.22–5.81)
RDW: 12.7 % (ref 11.5–15.5)
WBC: 4 10*3/uL (ref 4.0–10.5)

## 2018-12-30 LAB — MAGNESIUM: Magnesium: 1.8 mg/dL (ref 1.7–2.4)

## 2018-12-30 LAB — COMPREHENSIVE METABOLIC PANEL
ALBUMIN: 4.7 g/dL (ref 3.5–5.0)
ALT: 27 U/L (ref 0–44)
AST: 31 U/L (ref 15–41)
Alkaline Phosphatase: 50 U/L (ref 38–126)
Anion gap: 10 (ref 5–15)
BUN: 21 mg/dL (ref 8–23)
CALCIUM: 9.1 mg/dL (ref 8.9–10.3)
CO2: 23 mmol/L (ref 22–32)
CREATININE: 1.4 mg/dL — AB (ref 0.61–1.24)
Chloride: 102 mmol/L (ref 98–111)
GFR calc Af Amer: 55 mL/min — ABNORMAL LOW (ref >60)
GFR calc non Af Amer: 47 mL/min — ABNORMAL LOW (ref >60)
GLUCOSE: 234 mg/dL — AB (ref 70–99)
Potassium: 3.7 mmol/L (ref 3.5–5.1)
Sodium: 135 mmol/L (ref 135–145)
Total Bilirubin: 1 mg/dL (ref 0.3–1.2)
Total Protein: 8 g/dL (ref 6.5–8.1)

## 2018-12-30 LAB — INFLUENZA PANEL BY PCR (TYPE A & B)
Influenza A By PCR: NEGATIVE
Influenza B By PCR: NEGATIVE

## 2018-12-30 LAB — TROPONIN I: Troponin I: <0.03 ng/mL (ref <0.03)

## 2018-12-30 LAB — ETHANOL: Alcohol, Ethyl (B): <10 mg/dL (ref <10)

## 2018-12-30 NOTE — ED Notes (Signed)
Pt denies CP/SOB/dizziness at this time.  

## 2018-12-30 NOTE — Discharge Instructions (Signed)
You don't want to stay for further testing which is certainly your choice, but it limits our ability to continue checking your heart. However, so far we see no evidence of a heart attack. If you have any new or worrisome symptoms including chest pain or shortness of breath return to the ER. Otherwise, follow closely with your pcp.

## 2018-12-30 NOTE — ED Provider Notes (Addendum)
[facility] Emergency Department Provider Note  ____________________________________________   I have reviewed the triage vital signs and the nursing notes. Where available I have reviewed prior notes and, if possible and indicated, outside hospital notes.    HISTORY  Chief Complaint Palpitations    HPI Raymond Christian is a 78 y.o. male with a history of anxiety, hypertension, prostate cancer in the past presents today complaining of cough for the last couple days and cough when he lies down.  He does not have any chest pain, he states that sometimes this felt like his heart was going a little bit fast while he was coughing.  He denies any leg swelling, he states he is not on any Lasix or diuretics.  States while he was coughing the night he got up and walked around and walking around made it better.  Lying flat made it seem a little bit worse.  He states that is because his cough is worse when he lay flat he was try to suppress his cough.  No fevers, as long as he is walking or sitting straight he does not have too much problems except for the cough that when he was lying flat last night try to suppress his cough it seems worse.    Past Medical History:  Diagnosis Date  . Anxiety 07/09/2014  . Essential (primary) hypertension 07/09/2014  . Gastro-esophageal reflux disease without esophagitis 07/09/2014  . HLD (hyperlipidemia) 07/09/2014  . Malignant melanoma ([place]) 07/09/2014  . Prostate cancer ([place]) 06/2015  . Prostate cancer [facility])     Patient Active Problem List   Diagnosis Date Noted  . Renal cyst Bilateral, non-complex 04/09/2016  . Ureteral stone with hydronephrosis 09/13/2015  . Hydronephrosis with urinary obstruction due to ureteral calculus 09/06/2015  . Prostate cancer ([place]) 09/06/2015  . Anxiety 07/09/2014  . Essential (primary) hypertension 07/09/2014  . Gastro-esophageal reflux disease without esophagitis 07/09/2014  . Hyperlipidemia, unspecified  07/09/2014  . Malignant melanoma ([place]) 07/09/2014    Past Surgical History:  Procedure Laterality Date  . CYSTOSCOPY WITH RETROGRADE PYELOGRAM, URETEROSCOPY AND STENT PLACEMENT Right 09/14/2015   Procedure: CYSTOSCOPY WITH RETROGRADE PYELOGRAM, URETEROSCOPY AND STENT PLACEMENT;  Surgeon: Alexis Frock, MD;  Location: [facility];  Service: Urology;  Laterality: Right;  . HERNIA REPAIR     x2  . HOLMIUM LASER APPLICATION Right 02/54/2706   Procedure: HOLMIUM LASER APPLICATION;  Surgeon: Alexis Frock, MD;  Location: [facility];  Service: Urology;  Laterality: Right;  . MELANOMA EXCISION      Prior to Admission medications   Medication Sig Start Date End Date Taking? Authorizing Provider  amLODipine (NORVASC) 10 MG tablet TK 1 T PO QD 09/16/17   [provider]  losartan (COZAAR) 50 MG tablet Take 50 mg by mouth daily. 01/31/17   [provider]  omeprazole (PRILOSEC) 20 MG capsule Take 20 mg by mouth every other day.  06/13/15   [provider]  simvastatin (ZOCOR) 20 MG tablet Take 20 mg by mouth at bedtime.  06/13/15   [provider]  tamsulosin (FLOMAX) 0.4 MG CAPS capsule Take 1 capsule (0.4 mg total) by mouth daily after supper. 12/17/18   Noreene Filbert, MD    Allergies Patient has no known allergies.  Family History  Problem Relation Age of Onset  . Diabetes Sister   . Kidney disease Sister     Social History Social History   Tobacco Use  . Smoking status: Never Smoker  . Smokeless tobacco:  Never Used  Substance Use Topics  . Alcohol use: No    Alcohol/week: 0.0 standard drinks  . Drug use: No    Review of Systems Constitutional: No fever/chills Eyes: No visual changes. ENT: No sore throat. No stiff neck no neck pain Cardiovascular: Denies chest pain.  Positive cough Respiratory: Denies shortness of breath. Gastrointestinal:   no vomiting.  No diarrhea.  No constipation. Genitourinary: Negative for dysuria. Musculoskeletal: Negative  lower extremity swelling Skin: Negative for rash. Neurological: Negative for severe headaches, focal weakness or numbness.   ____________________________________________   PHYSICAL EXAM:  VITAL SIGNS: ED Triage Vitals  Enc Vitals Group     BP 12/30/18 0851 (!) 97/59     Pulse Rate 12/30/18 0849 90     Resp 12/30/18 0900 (!) 23     Temp --      Temp src --      SpO2 12/30/18 0849 98 %     Weight 12/30/18 0849 178 lb (80.7 kg)     Height 12/30/18 0849 5\' 8"  (1.727 m)     Head Circumference --      Peak Flow --      Pain Score 12/30/18 0849 0     Pain Loc --      Pain Edu? --      Excl. in [place]? --     Constitutional: Alert and oriented. Well appearing and in no acute distress. Eyes: Conjunctivae are normal Head: Atraumatic HEENT: No congestion/rhinnorhea. Mucous membranes are moist.  Oropharynx non-erythematous Neck:   Nontender with no meningismus, no masses, no stridor Cardiovascular: Normal rate, regular rhythm. Grossly normal heart sounds.  Good peripheral circulation. Respiratory: Normal respiratory effort.  No retractions. Lungs CTAB. Abdominal: Soft and nontender. No distention. No guarding no rebound Back:  There is no focal tenderness or step off.  there is no midline tenderness there are no lesions noted. there is no CVA tenderness  Musculoskeletal: No lower extremity tenderness, no upper extremity tenderness. No joint effusions, no DVT signs strong distal pulses no edema Neurologic:  Normal speech and language. No gross focal neurologic deficits are appreciated.  Skin:  Skin is warm, dry and intact. No rash noted. Psychiatric: Mood and affect are somewhat anxious. Speech and behavior are normal.  ____________________________________________   LABS (all labs ordered are listed, but only abnormal results are displayed)  Labs Reviewed  INFLUENZA PANEL BY PCR (TYPE A & B)  CBC WITH DIFFERENTIAL/PLATELET  COMPREHENSIVE METABOLIC PANEL  ETHANOL  TROPONIN I   MAGNESIUM    Pertinent labs  results that were available during my care of the patient were reviewed by me and considered in my medical decision making (see chart for details). ____________________________________________  EKG  I personally interpreted any EKGs ordered by me or triage Sinus lead 92, no acute ST elevation or depression, PVCs noted no acute ischemia ____________________________________________  RADIOLOGY  Pertinent labs & imaging results that were available during my care of the patient were reviewed by me and considered in my medical decision making (see chart for details). If possible, patient and/or family made aware of any abnormal findings.  No results found. ____________________________________________    PROCEDURES  Procedure(s) performed: None  Procedures  Critical Care performed: None  ____________________________________________   INITIAL IMPRESSION / ASSESSMENT AND PLAN / ED COURSE  Pertinent labs & imaging results that were available during my care of the patient were reviewed by me and considered in my medical decision making (see chart for details).  Patient here with cough for the last couple days, he is very well-appearing, he does not have any exertional symptoms nonetheless we will check troponin, also do BNP as he does have some symptoms that are worse when lying down which happens with cough as well as with CHF.  We will check chest x-ray, I will check basic blood work and we will reassess.  He is quite well-appearing at this time.  He is anxious but otherwise in no acute distress  ----------------------------------------- 10:44 AM on 12/30/2018 -----------------------------------------  Patient very reassured by my findings here as MI, I did suggest that it would be a good idea for him to stay for second enzyme even though he has been having these symptoms for a few days and he refuses.  He states "I think I just have a cough and I got  anxious".  I offered serial enzymes I did advise that he have them he understands I cannot rule out cardial injury without serial enzymes and that I cannot rule out angina at all in the emergency department except for clinically he is very comfortable with all this but states he wants to go home now he feels better, he just wanted to make sure a cough is just a cough he states.  Patient has no acute complaints no acute findings and his blood work is reassuring, and he refuses further care, will ensure that he is able to get around okay and we will try to get him home and his strong request.  Patient is leaving [place] but he states that he is very comfortable with this and I think he has capacity make that decision.  He will follow-up closely with his heart doctor and primary care doctor.  Blood pressure was document is running low but he had a cough on that was not accurately placed and to be for his arm will be adjusted and he is in the 140s which is where he is always been, he has absolutely no symptoms or complaints and he is eager to go home.    ____________________________________________   FINAL CLINICAL IMPRESSION(S) / ED DIAGNOSES  Final diagnoses:  None      This chart was dictated using voice recognition software.  Despite best efforts to proofread,  errors can occur which can change meaning.      Schuyler Amor, MD 12/30/18 0930    Schuyler Amor, MD 12/30/18 1046    Schuyler Amor, MD 12/30/18 [id number]

## 2018-12-30 NOTE — ED Triage Notes (Signed)
Patient presents to ED via POV from home with c/o palpitations and shortness of breath that began last night. Patient reports he was laying in bed when the palpitations began. Patient also endorses cough.

## 2018-12-30 NOTE — ED Notes (Signed)
Blood pressure cuff repositioned higher on arm and patient instructed to lay still and quiet with BP taking. New measurement recorded. MD aware

## 2018-12-30 NOTE — ED Notes (Signed)
Pt denies CP/SOB/Dizziness at this time. Pt is NAD noted at this time. Family at bedside

## 2018-12-30 NOTE — ED Notes (Signed)
ED Provider at bedside. 

## 2019-01-01 DIAGNOSIS — Z8546 Personal history of malignant neoplasm of prostate: Secondary | ICD-10-CM | POA: Insufficient documentation

## 2019-12-15 ENCOUNTER — Other Ambulatory Visit: Payer: Self-pay

## 2019-12-16 ENCOUNTER — Other Ambulatory Visit: Payer: Self-pay

## 2019-12-16 ENCOUNTER — Inpatient Hospital Stay: Payer: Commercial Managed Care - PPO | Attending: Radiation Oncology

## 2019-12-16 DIAGNOSIS — C61 Malignant neoplasm of prostate: Secondary | ICD-10-CM | POA: Insufficient documentation

## 2019-12-16 LAB — PSA: Prostatic Specific Antigen: 0.07 ng/mL (ref 0.00–4.00)

## 2019-12-22 ENCOUNTER — Other Ambulatory Visit: Payer: Self-pay

## 2019-12-23 ENCOUNTER — Ambulatory Visit
Admission: RE | Admit: 2019-12-23 | Discharge: 2019-12-23 | Disposition: A | Discharge status: Home / Self Care | Payer: Commercial Managed Care - PPO | Source: Ambulatory Visit | Attending: Radiation Oncology | Admitting: Radiation Oncology

## 2019-12-23 ENCOUNTER — Encounter: Payer: Self-pay | Admitting: Radiation Oncology

## 2019-12-23 ENCOUNTER — Other Ambulatory Visit: Payer: Self-pay

## 2019-12-23 VITALS — BP 136/75 | HR 84 | Temp 97.6°F | Resp 16 | Wt 180.4 lb

## 2019-12-23 DIAGNOSIS — C61 Malignant neoplasm of prostate: Secondary | ICD-10-CM | POA: Insufficient documentation

## 2019-12-23 DIAGNOSIS — Z923 Personal history of irradiation: Secondary | ICD-10-CM | POA: Insufficient documentation

## 2019-12-23 DIAGNOSIS — R232 Flushing: Secondary | ICD-10-CM | POA: Diagnosis not present

## 2019-12-23 NOTE — Progress Notes (Signed)
Radiation Oncology Follow up Note  Name: SHELLY RAGA   Date:   12/23/2019 MRN:  [id number] DOB: 1941-02-26    This 79 y.o. male presents to the clinic today for 4-year follow-up status post IMRT radiation therapy for Gleason 7 adenocarcinoma the prostate.  REFERRING PROVIDER: Dion Body, MD  HPI: Patient is an 79 year old male now out 4 years having completed IMRT radiation therapy to his prostate for a Gleason 7 (3+4) presenting with a PSA of 4.3.  He is seen today in routine follow-up is doing well from a urologic standpoint specifically denies diarrhea or dysuria he does have nocturia x3.  He complains of some fogginess of his head some hot flashes on the left side of his face on occasion I have assured this is nothing to do with radiation therapy treatments.  His most recent PSA is 0.07 that is stable..  COMPLICATIONS OF TREATMENT: none  FOLLOW UP COMPLIANCE: keeps appointments   PHYSICAL EXAM:  BP 136/75   Pulse 84   Temp 97.6 F (36.4 C)   Resp 16   Wt 180 lb 6.4 oz (81.8 kg)   BMI 27.43 kg/m  Well-developed well-nourished patient in NAD. HEENT reveals PERLA, EOMI, discs not visualized.  Oral cavity is clear. No oral mucosal lesions are identified. Neck is clear without evidence of cervical or supraclavicular adenopathy. Lungs are clear to A&P. Cardiac examination is essentially unremarkable with regular rate and rhythm without murmur rub or thrill. Abdomen is benign with no organomegaly or masses noted. Motor sensory and DTR levels are equal and symmetric in the upper and lower extremities. Cranial nerves II through XII are grossly intact. Proprioception is intact. No peripheral adenopathy or edema is identified. No motor or sensory levels are noted. Crude visual fields are within normal range.  RADIOLOGY RESULTS: No current films for review  PLAN: Present time patient is under excellent biochemical control almost 5 years out.  I am going to turn follow-up care over  to his PMD Dr. Netty Starring.  I have assured him the is symptoms of his hip face and head are not related to radiation.  Patient will contact us with any problems or concerns.  I would like to take this opportunity to thank you for allowing me to participate in the care of your patient.Noreene Filbert, MD

## 2020-03-08 DIAGNOSIS — N183 Chronic kidney disease, stage 3 unspecified: Secondary | ICD-10-CM | POA: Insufficient documentation

## 2021-03-11 ENCOUNTER — Telehealth: Payer: Self-pay | Admitting: Emergency Medicine

## 2021-03-11 ENCOUNTER — Ambulatory Visit
Admission: EM | Admit: 2021-03-11 | Discharge: 2021-03-11 | Disposition: A | Discharge status: Home / Self Care | Payer: Commercial Managed Care - PPO | Attending: Physician Assistant | Admitting: Physician Assistant

## 2021-03-11 ENCOUNTER — Other Ambulatory Visit: Payer: Self-pay

## 2021-03-11 ENCOUNTER — Encounter: Payer: Self-pay | Admitting: Emergency Medicine

## 2021-03-11 DIAGNOSIS — J069 Acute upper respiratory infection, unspecified: Secondary | ICD-10-CM | POA: Diagnosis present

## 2021-03-11 DIAGNOSIS — U071 COVID-19: Secondary | ICD-10-CM | POA: Insufficient documentation

## 2021-03-11 LAB — RESP PANEL BY RT-PCR (FLU A&B, COVID) ARPGX2
Influenza A by PCR: NEGATIVE
Influenza B by PCR: NEGATIVE
SARS Coronavirus 2 by RT PCR: POSITIVE — AB

## 2021-03-11 MED ORDER — GUAIFENESIN ER 600 MG PO TB12: 1200.0000 mg | ORAL_TABLET | Freq: Two times a day (BID) | ORAL | 0 refills | Status: AC | PRN

## 2021-03-11 MED ORDER — FLUTICASONE PROPIONATE 50 MCG/ACT NA SUSP: 1.0000 | Freq: Every day | NASAL | 2 refills | Status: DC

## 2021-03-11 MED ORDER — NIRMATRELVIR/RITONAVIR (PAXLOVID)TABLET: ORAL_TABLET | ORAL | 0 refills | Status: DC

## 2021-03-11 NOTE — Discharge Instructions (Signed)
Push fluids to ensure adequate hydration and keep secretions thin.  Tylenol and/or ibuprofen as needed for pain or fevers.  Mucinex twice a day to loosen secretions, daily flonase to help with your ear symptoms.  We will call you if your covid-19 test is positive.  If symptoms worsen or do not improve in the next week to return to be seen or to follow up with your PCP.

## 2021-03-11 NOTE — Telephone Encounter (Signed)
URI symptoms which started yesterday in this 80 year old male. Covid-19 testing is positive here in clinic. Paxlovid sent to pharmacy, GFR per [facility] labs on 5/9 of 42 without any obvious liver failure history. To stop simvastain for 12 hours before starting paxlovid, don't take during the 5 day course, and don't restart simvastatin for an additional 5 days following completion of paxlovid. Patient notified by nursing staff, Marsh Dolly, RN.

## 2021-03-11 NOTE — ED Provider Notes (Signed)
[facility]    CSN: [id number] Arrival date & time: 03/11/21  1305      History   Chief Complaint Chief Complaint  Patient presents with  . Cough    Nasal conges    HPI Raymond Christian is a 80 y.o. male.   Raymond Christian presents with complaints of some cough, but primarily congestion, which started yesterday. No known fevers but has felt warm. Left ear pain, but this is not necessarily new for him. No gi symptoms. No shortness of breath . No chest pain . He was around his sister who is ill. No history or asthma or copd.    ROS per HPI, negative if not otherwise mentioned.      Past Medical History:  Diagnosis Date  . Anxiety 07/09/2014  . Essential (primary) hypertension 07/09/2014  . Gastro-esophageal reflux disease without esophagitis 07/09/2014  . HLD (hyperlipidemia) 07/09/2014  . Malignant melanoma ([place]) 07/09/2014  . Prostate cancer ([place]) 06/2015  . Prostate cancer [facility])     Patient Active Problem List   Diagnosis Date Noted  . Renal cyst Bilateral, non-complex 04/09/2016  . Ureteral stone with hydronephrosis 09/13/2015  . Hydronephrosis with urinary obstruction due to ureteral calculus 09/06/2015  . Prostate cancer ([place]) 09/06/2015  . Anxiety 07/09/2014  . Essential (primary) hypertension 07/09/2014  . Gastro-esophageal reflux disease without esophagitis 07/09/2014  . Hyperlipidemia, unspecified 07/09/2014  . Malignant melanoma ([place]) 07/09/2014    Past Surgical History:  Procedure Laterality Date  . CYSTOSCOPY WITH RETROGRADE PYELOGRAM, URETEROSCOPY AND STENT PLACEMENT Right 09/14/2015   Procedure: CYSTOSCOPY WITH RETROGRADE PYELOGRAM, URETEROSCOPY AND STENT PLACEMENT;  Surgeon: Alexis Frock, MD;  Location: [facility];  Service: Urology;  Laterality: Right;  . HERNIA REPAIR     x2  . HOLMIUM LASER APPLICATION Right 16/96/7893   Procedure: HOLMIUM LASER APPLICATION;  Surgeon: Alexis Frock, MD;  Location: [facility];  Service: Urology;   Laterality: Right;  . MELANOMA EXCISION         Home Medications    Prior to Admission medications   Medication Sig Start Date End Date Taking? Authorizing Provider  amLODipine (NORVASC) 10 MG tablet TK 1 T PO QD 09/16/17  Yes [provider]  fluticasone (FLONASE) 50 MCG/ACT nasal spray Place 1 spray into both nostrils daily. 03/11/21  Yes Ceciley Buist, Malachy Moan, NP  guaiFENesin (MUCINEX) 600 MG 12 hr tablet Take 2 tablets (1,200 mg total) by mouth 2 (two) times daily as needed for up to 5 days. 03/11/21 03/16/21 Yes Marissah Vandemark, Malachy Moan, NP  losartan (COZAAR) 50 MG tablet Take 50 mg by mouth daily. 01/31/17  Yes [provider]  omeprazole (PRILOSEC) 20 MG capsule Take 20 mg by mouth every other day.  06/13/15  Yes [provider]  simvastatin (ZOCOR) 20 MG tablet Take 20 mg by mouth at bedtime.  06/13/15  Yes [provider]  tamsulosin (FLOMAX) 0.4 MG CAPS capsule Take 1 capsule (0.4 mg total) by mouth daily after supper. Patient not taking: Reported on 12/23/2019 12/17/18   Noreene Filbert, MD    Family History Family History  Problem Relation Age of Onset  . Diabetes Sister   . Kidney disease Sister     Social History Social History   Tobacco Use  . Smoking status: Never Smoker  . Smokeless tobacco: Never Used  Vaping Use  . Vaping Use: Never used  Substance Use Topics  . Alcohol use: No    Alcohol/week: 0.0 standard drinks  .  Drug use: No     Allergies   Patient has no known allergies.   Review of Systems Review of Systems   Physical Exam Triage Vital Signs ED Triage Vitals  Enc Vitals Group     BP 03/11/21 1321 (!) 147/65     Pulse Rate 03/11/21 1321 65     Resp 03/11/21 1321 16     Temp 03/11/21 1321 99.9 F (37.7 C)     Temp Source 03/11/21 1321 Oral     SpO2 03/11/21 1321 98 %     Weight 03/11/21 1318 178 lb (80.7 kg)     Height 03/11/21 1318 5\' 8"  (1.727 m)     Head Circumference --      Peak Flow --      Pain Score  03/11/21 1318 0     Pain Loc --      Pain Edu? --      Excl. in [place]? --    No data found.  Updated Vital Signs BP (!) 147/65 (BP Location: Left Arm)   Pulse 65   Temp 99.9 F (37.7 C) (Oral)   Resp 16   Ht 5\' 8"  (1.727 m)   Wt 178 lb (80.7 kg)   SpO2 98%   BMI 27.06 kg/m   Visual Acuity Right Eye Distance:   Left Eye Distance:   Bilateral Distance:    Right Eye Near:   Left Eye Near:    Bilateral Near:     Physical Exam Constitutional:      Appearance: He is well-developed.  HENT:     Ears:     Comments: Air fluid noted to bilateral TMs without redness or bulging; all land marks visible     Nose: Rhinorrhea present.  Cardiovascular:     Rate and Rhythm: Normal rate.  Pulmonary:     Effort: Pulmonary effort is normal.     Breath sounds: Normal breath sounds.  Skin:    General: Skin is warm and dry.  Neurological:     Mental Status: He is alert and oriented to person, place, and time.      UC Treatments / Results  Labs (all labs ordered are listed, but only abnormal results are displayed) Labs Reviewed  RESP PANEL BY RT-PCR (FLU A&B, COVID) [id number]    EKG   Radiology No results found.  Procedures Procedures (including critical care time)  Medications Ordered in UC Medications - No data to display  Initial Impression / Assessment and Plan / UC Course  I have reviewed the triage vital signs and the nursing notes.  Pertinent labs & imaging results that were available during my care of the patient were reviewed by me and considered in my medical decision making (see chart for details).     Non toxic. Benign physical exam.  History and physical consistent with viral illness.  Covid testing pending and isolation instructions provided. Supportive cares recommended. Return precautions provided. Patient verbalized understanding and agreeable to plan.   Final Clinical Impressions(s) / UC Diagnoses   Final diagnoses:  Upper respiratory tract infection,  unspecified type     Discharge Instructions     Push fluids to ensure adequate hydration and keep secretions thin.  Tylenol and/or ibuprofen as needed for pain or fevers.  Mucinex twice a day to loosen secretions, daily flonase to help with your ear symptoms.  We will call you if your covid-19 test is positive.  If symptoms worsen or do not improve in the next  week to return to be seen or to follow up with your PCP.     ED Prescriptions    Medication Sig Dispense Auth. Provider   guaiFENesin (MUCINEX) 600 MG 12 hr tablet Take 2 tablets (1,200 mg total) by mouth 2 (two) times daily as needed for up to 5 days. 20 tablet Augusto Gamble B, NP   fluticasone (FLONASE) 50 MCG/ACT nasal spray Place 1 spray into both nostrils daily. 16 g Zigmund Gottron, NP     PDMP not reviewed this encounter.   Zigmund Gottron, NP 03/11/21 1358

## 2021-03-11 NOTE — ED Triage Notes (Signed)
Patient c/o cough, runny nose, and bodyaches that started yesterday.  Patient denies fever.  Patient took covid home test yesterday and was negative.

## 2021-03-11 NOTE — Telephone Encounter (Signed)
Spoke to patient and patient gave me permission to also speak to his wife.  Both were informed that his COVID test came back positive and that they would need to quarantine for the next 5 days.  Patient was instructed to stop taking his simvastatin while taking the covid medication.  Patient states that he last took his simvastatin at 7:30pm last night.  Patient was instructed that he could start the covid medicine after 7:30pm tonight.  Patient was instructed that after his last dose of the covid medicine he is to wait 5 days till starting back taking the simvastatin.  Both the patient and the wife verbalized understanding.  Advised to call us if they had any further questions.

## 2021-03-13 DIAGNOSIS — U071 COVID-19: Secondary | ICD-10-CM | POA: Insufficient documentation

## 2021-03-23 ENCOUNTER — Ambulatory Visit
Admission: EM | Admit: 2021-03-23 | Discharge: 2021-03-23 | Disposition: A | Discharge status: Home / Self Care | Payer: Commercial Managed Care - PPO | Attending: Family Medicine | Admitting: Family Medicine

## 2021-03-23 ENCOUNTER — Other Ambulatory Visit: Payer: Self-pay

## 2021-03-23 ENCOUNTER — Ambulatory Visit (INDEPENDENT_AMBULATORY_CARE_PROVIDER_SITE_OTHER): Payer: Commercial Managed Care - PPO

## 2021-03-23 DIAGNOSIS — J069 Acute upper respiratory infection, unspecified: Secondary | ICD-10-CM | POA: Diagnosis not present

## 2021-03-23 DIAGNOSIS — R059 Cough, unspecified: Secondary | ICD-10-CM | POA: Diagnosis not present

## 2021-03-23 LAB — RAPID INFLUENZA A&B ANTIGENS
Influenza A (ARMC): NEGATIVE
Influenza B (ARMC): NEGATIVE

## 2021-03-23 MED ORDER — BENZONATATE 200 MG PO CAPS: 200.0000 mg | ORAL_CAPSULE | Freq: Three times a day (TID) | ORAL | 0 refills | Status: DC | PRN

## 2021-03-23 NOTE — ED Triage Notes (Signed)
Patient states that he had covid on 03/10/2021. States that he improved and went to a big party and is concerned he may have the flu. States that he is having cough, body aches, fever x 2-3 days.

## 2021-03-23 NOTE — ED Provider Notes (Signed)
[facility]    CSN: [id number] Arrival date & time: 03/23/21  1010      History   Chief Complaint Chief Complaint  Patient presents with  . Cough   HPI  80 year old male presents with respiratory symptoms.  Has had COVID recently.  Reports that he has been feeling okay until past 2 to 3 days.  Reports cough, runny nose.  Reported fever and body aches to the nursing staff but denied them repeatedly to me.  Reports that he has had no fatigue.  He states that he feels stopped up.  He is using Flonase and cough medication with improvement.  No other reported symptoms.  No other complaints.  Past Medical History:  Diagnosis Date  . Anxiety 07/09/2014  . Essential (primary) hypertension 07/09/2014  . Gastro-esophageal reflux disease without esophagitis 07/09/2014  . HLD (hyperlipidemia) 07/09/2014  . Malignant melanoma ([place]) 07/09/2014  . Prostate cancer ([place]) 06/2015  . Prostate cancer [facility])     Patient Active Problem List   Diagnosis Date Noted  . Renal cyst Bilateral, non-complex 04/09/2016  . Ureteral stone with hydronephrosis 09/13/2015  . Hydronephrosis with urinary obstruction due to ureteral calculus 09/06/2015  . Prostate cancer ([place]) 09/06/2015  . Anxiety 07/09/2014  . Essential (primary) hypertension 07/09/2014  . Gastro-esophageal reflux disease without esophagitis 07/09/2014  . Hyperlipidemia, unspecified 07/09/2014  . Malignant melanoma ([place]) 07/09/2014    Past Surgical History:  Procedure Laterality Date  . CYSTOSCOPY WITH RETROGRADE PYELOGRAM, URETEROSCOPY AND STENT PLACEMENT Right 09/14/2015   Procedure: CYSTOSCOPY WITH RETROGRADE PYELOGRAM, URETEROSCOPY AND STENT PLACEMENT;  Surgeon: Alexis Frock, MD;  Location: [facility];  Service: Urology;  Laterality: Right;  . HERNIA REPAIR     x2  . HOLMIUM LASER APPLICATION Right 86/76/7209   Procedure: HOLMIUM LASER APPLICATION;  Surgeon: Alexis Frock, MD;  Location: [facility];  Service: Urology;  Laterality:  Right;  . MELANOMA EXCISION         Home Medications    Prior to Admission medications   Medication Sig Start Date End Date Taking? Authorizing Provider  amLODipine (NORVASC) 10 MG tablet TK 1 T PO QD 09/16/17  Yes [provider]  benzonatate (TESSALON) 200 MG capsule Take 1 capsule (200 mg total) by mouth 3 (three) times daily as needed for cough. 03/23/21  Yes Yarah Fuente G, DO  fluticasone (FLONASE) 50 MCG/ACT nasal spray Place 1 spray into both nostrils daily. 03/11/21  Yes Burky, Lanelle Bal B, NP  losartan (COZAAR) 50 MG tablet Take 50 mg by mouth daily. 01/31/17  Yes [provider]  omeprazole (PRILOSEC) 20 MG capsule Take 20 mg by mouth every other day.  06/13/15  Yes [provider]  simvastatin (ZOCOR) 20 MG tablet Take 20 mg by mouth at bedtime.  06/13/15  Yes [provider]  tamsulosin (FLOMAX) 0.4 MG CAPS capsule Take 1 capsule (0.4 mg total) by mouth daily after supper. 12/17/18  Yes Noreene Filbert, MD    Family History Family History  Problem Relation Age of Onset  . Diabetes Sister   . Kidney disease Sister     Social History Social History   Tobacco Use  . Smoking status: Never Smoker  . Smokeless tobacco: Never Used  Vaping Use  . Vaping Use: Never used  Substance Use Topics  . Alcohol use: No    Alcohol/week: 0.0 standard drinks  . Drug use: No     Allergies   Patient has no known allergies.   Review of  Systems Review of Systems Per HPI  Physical Exam Triage Vital Signs ED Triage Vitals  Enc Vitals Group     BP 03/23/21 1119 (!) 130/99     Pulse Rate 03/23/21 1119 87     Resp 03/23/21 1119 18     Temp 03/23/21 1119 99.3 F (37.4 C)     Temp Source 03/23/21 1119 Oral     SpO2 03/23/21 1119 96 %     Weight 03/23/21 1118 176 lb 11.2 oz (80.2 kg)     Height --      Head Circumference --      Peak Flow --      Pain Score 03/23/21 1241 0     Pain Loc --      Pain Edu? --      Excl. in [place]? --    Updated  Vital Signs BP (!) 130/99 (BP Location: Left Arm)   Pulse 87   Temp 99.3 F (37.4 C) (Oral)   Resp 18   Wt 80.2 kg   SpO2 96%   BMI 26.87 kg/m   Visual Acuity Right Eye Distance:   Left Eye Distance:   Bilateral Distance:    Right Eye Near:   Left Eye Near:    Bilateral Near:     Physical Exam Constitutional:      General: He is not in acute distress.    Appearance: Normal appearance. He is not ill-appearing.  HENT:     Head: Normocephalic and atraumatic.     Nose: Nose normal.  Eyes:     General:        Right eye: No discharge.        Left eye: No discharge.     Conjunctiva/sclera: Conjunctivae normal.  Cardiovascular:     Rate and Rhythm: Normal rate and regular rhythm.  Pulmonary:     Effort: Pulmonary effort is normal.     Breath sounds: Normal breath sounds. No wheezing, rhonchi or rales.  Neurological:     Mental Status: He is alert.  Psychiatric:        Mood and Affect: Mood normal.        Behavior: Behavior normal.    UC Treatments / Results  Labs (all labs ordered are listed, but only abnormal results are displayed) Labs Reviewed  RAPID INFLUENZA A&B ANTIGENS    EKG   Radiology DG Chest 2 View  Result Date: 03/23/2021 CLINICAL DATA:  Cough. EXAM: CHEST - 2 VIEW COMPARISON:  December 30, 2018. FINDINGS: Similar cardiomediastinal silhouette. Calcific atherosclerosis of the aorta. Tortuous aorta. Large hiatal hernia. Both lungs are clear. No visible pleural effusions or pneumothorax. No acute osseous abnormality. IMPRESSION: 1. No acute cardiopulmonary disease. 2. Large hiatal hernia. Electronically Signed   By: Margaretha Sheffield MD   On: 03/23/2021 12:28    Procedures Procedures (including critical care time)  Medications Ordered in UC Medications - No data to display  Initial Impression / Assessment and Plan / UC Course  I have reviewed the triage vital signs and the nursing notes.  Pertinent labs & imaging results that were available during my  care of the patient were reviewed by me and considered in my medical decision making (see chart for details).    80 year old male presents with a viral URI with cough.  Flu testing negative.  Chest x-ray was obtained given age and recent COVID-19 in the setting of respiratory symptoms.  Chest x-ray was independently interpreted by me.  Interpretation: Normal chest  x-ray.  No evidence of pneumonia.  Tessalon Perles for cough.  Supportive care.  Final Clinical Impressions(s) / UC Diagnoses   Final diagnoses:  Viral URI with cough     Discharge Instructions     Continue the flonase.  Cough medication as needed.  Take care  Dr. Lacinda Axon    ED Prescriptions    Medication Sig Dispense Auth. Provider   benzonatate (TESSALON) 200 MG capsule Take 1 capsule (200 mg total) by mouth 3 (three) times daily as needed for cough. 30 capsule Coral Spikes, DO     PDMP not reviewed this encounter.   Coral Spikes, DO 03/23/21 1324

## 2021-03-23 NOTE — Discharge Instructions (Addendum)
Continue the flonase.  Cough medication as needed.  Take care  Dr. Lacinda Axon

## 2021-04-15 ENCOUNTER — Other Ambulatory Visit: Payer: Self-pay

## 2021-04-15 ENCOUNTER — Emergency Department
Admission: EM | Admit: 2021-04-15 | Discharge: 2021-04-15 | Disposition: A | Discharge status: Home / Self Care | Payer: Medicare Other | Attending: Student in an Organized Health Care Education/Training Program | Admitting: Student in an Organized Health Care Education/Training Program

## 2021-04-15 ENCOUNTER — Emergency Department: Payer: Medicare Other

## 2021-04-15 DIAGNOSIS — R519 Headache, unspecified: Secondary | ICD-10-CM | POA: Diagnosis present

## 2021-04-15 DIAGNOSIS — Z79899 Other long term (current) drug therapy: Secondary | ICD-10-CM | POA: Diagnosis not present

## 2021-04-15 DIAGNOSIS — Z8546 Personal history of malignant neoplasm of prostate: Secondary | ICD-10-CM | POA: Insufficient documentation

## 2021-04-15 DIAGNOSIS — I10 Essential (primary) hypertension: Secondary | ICD-10-CM | POA: Insufficient documentation

## 2021-04-15 LAB — CBC WITH DIFFERENTIAL/PLATELET
Abs Immature Granulocytes: 0.01 10*3/uL (ref 0.00–0.07)
Basophils Absolute: 0.1 10*3/uL (ref 0.0–0.1)
Basophils Relative: 1 %
Eosinophils Absolute: 0.1 10*3/uL (ref 0.0–0.5)
Eosinophils Relative: 2 %
HCT: 39.4 % (ref 39.0–52.0)
Hemoglobin: 13.8 g/dL (ref 13.0–17.0)
Immature Granulocytes: 0 %
Lymphocytes Relative: 34 %
Lymphs Abs: 2.4 10*3/uL (ref 0.7–4.0)
MCH: 31.9 pg (ref 26.0–34.0)
MCHC: 35 g/dL (ref 30.0–36.0)
MCV: 91 fL (ref 80.0–100.0)
Monocytes Absolute: 0.7 10*3/uL (ref 0.1–1.0)
Monocytes Relative: 11 %
Neutro Abs: 3.6 10*3/uL (ref 1.7–7.7)
Neutrophils Relative %: 52 %
Platelets: 301 10*3/uL (ref 150–400)
RBC: 4.33 MIL/uL (ref 4.22–5.81)
RDW: 12.5 % (ref 11.5–15.5)
WBC: 6.9 10*3/uL (ref 4.0–10.5)
nRBC: 0 % (ref 0.0–0.2)

## 2021-04-15 LAB — COMPREHENSIVE METABOLIC PANEL
ALT: 15 U/L (ref 0–44)
AST: 28 U/L (ref 15–41)
Albumin: 4.1 g/dL (ref 3.5–5.0)
Alkaline Phosphatase: 58 U/L (ref 38–126)
Anion gap: 8 (ref 5–15)
BUN: 30 mg/dL — ABNORMAL HIGH (ref 8–23)
CO2: 23 mmol/L (ref 22–32)
Calcium: 9 mg/dL (ref 8.9–10.3)
Chloride: 106 mmol/L (ref 98–111)
Creatinine, Ser: 1.82 mg/dL — ABNORMAL HIGH (ref 0.61–1.24)
GFR, Estimated: 37 mL/min — ABNORMAL LOW (ref >60)
Glucose, Bld: 217 mg/dL — ABNORMAL HIGH (ref 70–99)
Potassium: 4.2 mmol/L (ref 3.5–5.1)
Sodium: 137 mmol/L (ref 135–145)
Total Bilirubin: 0.7 mg/dL (ref 0.3–1.2)
Total Protein: 7.8 g/dL (ref 6.5–8.1)

## 2021-04-15 MED ORDER — ACETAMINOPHEN 325 MG PO TABS
650.0000 mg | ORAL_TABLET | Freq: Once | ORAL | Status: DC
  Filled 2021-04-15: qty 2

## 2021-04-15 MED ORDER — IOHEXOL 350 MG/ML SOLN
100.0000 mL | Freq: Once | INTRAVENOUS | Status: AC | PRN
  Administered 2021-04-15: 75 mL via INTRAVENOUS

## 2021-04-15 MED ORDER — SODIUM CHLORIDE 0.9 % IV BOLUS
500.0000 mL | Freq: Once | INTRAVENOUS | Status: AC
  Administered 2021-04-15: 500 mL via INTRAVENOUS

## 2021-04-15 NOTE — ED Notes (Signed)
Spoke with dr. Quentin Cornwall regarding pt's presenting symptoms, orders received.

## 2021-04-15 NOTE — ED Provider Notes (Deleted)
Patient seen in conjunction with PA Cuthriell.  Patient presenting with right parietal intermittent brief episodic headache.  No neurodeficits.  No sign of aneurysm not meningitic.  No overlying skin lesions.  Does not seem consistent with GCA.  Not consistent with mass.  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  He is not having any symptoms of stroke.  Likely chronic given no previous imaging and as he is asymptomatic.  Have recommended aspirin and close outpatient follow-up with neurology.  Patient agreeable to plan.  Feels well at this time.   Merlyn Lot, MD 04/15/21 2258

## 2021-04-15 NOTE — ED Notes (Signed)
Called and updated family.  

## 2021-04-15 NOTE — ED Provider Notes (Signed)
[facility] Emergency Department Provider Note  ____________________________________________  Time seen: Approximately 7:31 PM  I have reviewed the triage vital signs and the nursing notes.   HISTORY  Chief Complaint Headache    HPI Raymond Christian is a 80 y.o. male who presents the emergency department complaining of sharp sporadic headache.  Patient states that roughly noon today or 7 hours ago he started to have a sharp sensation in his right parietal scalp goal.  Patient states that it feels like there is an ice pick being driven through his skull into his brain.  It only occurs in this location.  Its intermittent but states that sometimes will have 1 twinge, and it could be 30 minutes before the next sensation.  Or he states that he may have 4 or 5 repetitive sensations all in a row.  He denies any vision changes.  He denies any trauma to the head.  No difficulty formulating thoughts or words.  No unilateral weakness.  He denies any chest pain, shortness of breath, nausea.  States that he has no history of headaches.  No medications prior to arrival.  No other complaints at this time.       Past Medical History:  Diagnosis Date   Anxiety 07/09/2014   Essential (primary) hypertension 07/09/2014   Gastro-esophageal reflux disease without esophagitis 07/09/2014   HLD (hyperlipidemia) 07/09/2014   Malignant melanoma ([place]) 07/09/2014   Prostate cancer ([place]) 06/2015   Prostate cancer [facility])     Patient Active Problem List   Diagnosis Date Noted   Renal cyst Bilateral, non-complex 04/09/2016   Ureteral stone with hydronephrosis 09/13/2015   Hydronephrosis with urinary obstruction due to ureteral calculus 09/06/2015   Prostate cancer ([place]) 09/06/2015   Anxiety 07/09/2014   Essential (primary) hypertension 07/09/2014   Gastro-esophageal reflux disease without esophagitis 07/09/2014   Hyperlipidemia, unspecified 07/09/2014   Malignant melanoma ([place]) 07/09/2014     Past Surgical History:  Procedure Laterality Date   CYSTOSCOPY WITH RETROGRADE PYELOGRAM, URETEROSCOPY AND STENT PLACEMENT Right 09/14/2015   Procedure: CYSTOSCOPY WITH RETROGRADE PYELOGRAM, URETEROSCOPY AND STENT PLACEMENT;  Surgeon: Alexis Frock, MD;  Location: [facility];  Service: Urology;  Laterality: Right;   HERNIA REPAIR     x2   HOLMIUM LASER APPLICATION Right 17/00/1749   Procedure: HOLMIUM LASER APPLICATION;  Surgeon: Alexis Frock, MD;  Location: [facility];  Service: Urology;  Laterality: Right;   MELANOMA EXCISION      Prior to Admission medications   Medication Sig Start Date End Date Taking? Authorizing Provider  amLODipine (NORVASC) 10 MG tablet TK 1 T PO QD 09/16/17   [provider]  benzonatate (TESSALON) 200 MG capsule Take 1 capsule (200 mg total) by mouth 3 (three) times daily as needed for cough. 03/23/21   Coral Spikes, DO  fluticasone (FLONASE) 50 MCG/ACT nasal spray Place 1 spray into both nostrils daily. 03/11/21   Zigmund Gottron, NP  losartan (COZAAR) 50 MG tablet Take 50 mg by mouth daily. 01/31/17   [provider]  omeprazole (PRILOSEC) 20 MG capsule Take 20 mg by mouth every other day.  06/13/15   [provider]  simvastatin (ZOCOR) 20 MG tablet Take 20 mg by mouth at bedtime.  06/13/15   [provider]  tamsulosin (FLOMAX) 0.4 MG CAPS capsule Take 1 capsule (0.4 mg total) by mouth daily after supper. 12/17/18   Noreene Filbert, MD    Allergies Patient has no known allergies.  Family History  Problem Relation Age of Onset   Diabetes Sister    Kidney disease Sister     Social History Social History   Tobacco Use   Smoking status: Never   Smokeless tobacco: Never  Vaping Use   Vaping Use: Never used  Substance Use Topics   Alcohol use: No    Alcohol/week: 0.0 standard drinks   Drug use: No     Review of Systems  Constitutional: No fever/chills.  Sharp right parietal skull/"brain" pain Eyes: No visual  changes. No discharge ENT: No upper respiratory complaints. Cardiovascular: no chest pain. Respiratory: no cough. No SOB. Gastrointestinal: No abdominal pain.  No nausea, no vomiting.  No diarrhea.  No constipation. Genitourinary: Negative for dysuria. No hematuria Musculoskeletal: Negative for musculoskeletal pain. Skin: No rashes. Neurological: Negative for headaches, focal weakness or numbness.  10 System ROS otherwise negative.  ____________________________________________   PHYSICAL EXAM:  VITAL SIGNS: ED Triage Vitals [04/15/21 1859]  Enc Vitals Group     BP (!) 145/79     Pulse Rate 97     Resp 16     Temp (!) 97.5 F (36.4 C)     Temp Source Oral     SpO2 100 %     Weight 178 lb (80.7 kg)     Height 5\' 8"  (1.727 m)     Head Circumference      Peak Flow      Pain Score 10     Pain Loc      Pain Edu?      Excl. in [place]?      Constitutional: Alert and oriented. Well appearing and in no acute distress. Eyes: Conjunctivae are normal. PERRL. EOMI. Head: Atraumatic.  No visible findings to the scalp.  Patient is nontender to palpation over the osseous structures of the scalp.  No palpable abnormalities or crepitus.  No battle signs, raccoon eyes, serosanguineous fluid drainage from the ears or nares.  No tenderness over the temporal regions.  No palpable lesions or cords. ENT:      Ears:       Nose: No congestion/rhinnorhea.      Mouth/Throat: Mucous membranes are moist.  Neck: No stridor.  No cervical spine tenderness to palpation. Hematological/Lymphatic/Immunilogical: No cervical lymphadenopathy. Cardiovascular: Normal rate, regular rhythm. Normal S1 and S2.  Good peripheral circulation. Respiratory: Normal respiratory effort without tachypnea or retractions. Lungs CTAB. Good air entry to the bases with no decreased or absent breath sounds. Musculoskeletal: Full range of motion to all extremities. No gross deformities appreciated. Neurologic:  Normal speech and  language. No gross focal neurologic deficits are appreciated.  Cranial nerves II through XII grossly intact.  Negative Romberg's and pronator drift.  Equal grip strength bilateral upper extremities. Skin:  Skin is warm, dry and intact. No rash noted. Psychiatric: Mood and affect are normal. Speech and behavior are normal. Patient exhibits appropriate insight and judgement.   ____________________________________________   LABS (all labs ordered are listed, but only abnormal results are displayed)  Labs Reviewed  COMPREHENSIVE METABOLIC PANEL - Abnormal; Notable for the following components:      Result Value   Glucose, Bld 217 (*)    BUN 30 (*)    Creatinine, Ser 1.82 (*)    GFR, Estimated 37 (*)    All other components within normal limits  CBC WITH DIFFERENTIAL/PLATELET   ____________________________________________  EKG   ____________________________________________  RADIOLOGY I personally viewed and evaluated these images as part of my medical decision making,  as well as reviewing the written report by the radiologist.  ED Provider Interpretation: No acute findings on CT head without.  I  CT HEAD WO CONTRAST  Result Date: 04/15/2021 CLINICAL DATA:  Hervey Ard shooting pains to the right parietal head lasting a few seconds at the time. EXAM: CT HEAD WITHOUT CONTRAST TECHNIQUE: Contiguous axial images were obtained from the base of the skull through the vertex without intravenous contrast. COMPARISON:  MRI brain 10/31/2009 FINDINGS: Brain: No evidence of acute infarction, hemorrhage, hydrocephalus, extra-axial collection or mass lesion/mass effect. Diffuse cerebral atrophy. Patchy low-attenuation changes in the deep white matter consistent with small vessel ischemia. Vascular: Moderate intracranial arterial vascular calcifications. Skull: Calvarium appears intact. Sinuses/Orbits: Paranasal sinuses are clear. Opacification of portions of the bilateral mastoid air cells. Other: None.  IMPRESSION: No acute intracranial abnormalities. Chronic atrophy and small vessel ischemic changes. Bilateral mastoid effusions. Electronically Signed   By: Lucienne Capers M.D.   On: 04/15/2021 20:07    ____________________________________________    PROCEDURES  Procedure(s) performed:    Procedures    Medications  acetaminophen (TYLENOL) tablet 650 mg (has no administration in time range)  sodium chloride 0.9 % bolus 500 mL (has no administration in time range)     ____________________________________________   INITIAL IMPRESSION / ASSESSMENT AND PLAN / ED COURSE  Pertinent labs & imaging results that were available during my care of the patient were reviewed by me and considered in my medical decision making (see chart for details).  Review of the [place] CSRS was performed in accordance of the [place] prior to dispensing any controlled drugs.          Patient's diagnosis is consistent with headache.  Patient presented to the emergency department complaining of a stabbing type headache in 1 spot in the occipital skull.  No history of headache syndrome.  Patient had no other symptoms.  No recent trauma.  Overall exam is reassuring with patient being neurologically intact.  At this time initial imaging had returned without acute findings but given the nature of this headache felt like patient would likely benefit from CT angio.  This is pending at this time.  Final diagnosis and disposition will be provided by attending provider, Dr. Quentin Cornwall.     This chart was dictated using voice recognition software/Dragon. Despite best efforts to proofread, errors can occur which can change the meaning. Any change was purely unintentional.    Jashua Knaak, Jarome Lamas 04/15/21 2100    Merlyn Lot, MD 04/15/21 2226    Merlyn Lot, MD 04/15/21 2259

## 2021-04-15 NOTE — Discharge Instructions (Addendum)

## 2021-04-15 NOTE — ED Triage Notes (Signed)
Pt states began around lunchtime to have sharp shooting pain to right parietal head that lasts a few seconds then abates for a few minutes before coming back. Pt states he feels like his balance has been off but cannot pinpoint a time when that began.slight drooping of right eye lid noted when smiling, but face is symmetrical in other areas, strong equal grips and foot presses, sensation intact. Speech clear.

## 2021-04-24 DIAGNOSIS — I6522 Occlusion and stenosis of left carotid artery: Secondary | ICD-10-CM | POA: Insufficient documentation

## 2021-05-12 ENCOUNTER — Ambulatory Visit (INDEPENDENT_AMBULATORY_CARE_PROVIDER_SITE_OTHER): Payer: Commercial Managed Care - PPO | Admitting: Vascular Surgery

## 2021-05-12 ENCOUNTER — Other Ambulatory Visit: Payer: Self-pay

## 2021-05-12 VITALS — BP 102/75 | HR 91 | Ht 68.0 in | Wt 169.0 lb

## 2021-05-12 DIAGNOSIS — N183 Chronic kidney disease, stage 3 unspecified: Secondary | ICD-10-CM

## 2021-05-12 DIAGNOSIS — E785 Hyperlipidemia, unspecified: Secondary | ICD-10-CM

## 2021-05-12 DIAGNOSIS — I771 Stricture of artery: Secondary | ICD-10-CM

## 2021-05-12 DIAGNOSIS — I6522 Occlusion and stenosis of left carotid artery: Secondary | ICD-10-CM | POA: Diagnosis not present

## 2021-05-12 DIAGNOSIS — I10 Essential (primary) hypertension: Secondary | ICD-10-CM

## 2021-05-12 NOTE — Progress Notes (Signed)
Patient ID: Raymond Christian, male   DOB: 1941-08-28, 80 y.o.   MRN: [id number]  Chief Complaint  Patient presents with   New Patient (Initial Visit)    NP [place]. Carotid atenosis consult. CT in the system from 04/15/21    HPI Raymond Christian is a 80 y.o. male.  I am asked to see the patient by Dr. Netty Starring for evaluation of left carotid occlusion seen on a CT of the head last month that I have independently reviewed.  The patient reports headaches over several weeks starting a couple of months ago that prompted some of the work-up.  He has a previous history of left neck resection with radiation over 5 years ago.  He has not had any stroke or TIA symptoms. Specifically, the patient denies amaurosis fugax, speech or swallowing difficulties, or arm or leg weakness or numbness.  The CT scan was a CT angiogram of the head and there is a distal left ICA occlusion.  The intracranial flow appears reasonably good through extensive collaterals.  The patient has not had a carotid duplex or any evaluation of the cervical carotid arteries although 1 would expect the left side to be occluded with this finding.  Patient also reports a lower blood pressure in his right arm that has been present for many years.  No vertebrobasilar symptoms.  No right arm claudication symptoms.   Past Medical History:  Diagnosis Date   Anxiety 07/09/2014   Essential (primary) hypertension 07/09/2014   Gastro-esophageal reflux disease without esophagitis 07/09/2014   HLD (hyperlipidemia) 07/09/2014   Malignant melanoma ([place]) 07/09/2014   Prostate cancer ([place]) 06/2015   Prostate cancer [facility])     Past Surgical History:  Procedure Laterality Date   CYSTOSCOPY WITH RETROGRADE PYELOGRAM, URETEROSCOPY AND STENT PLACEMENT Right 09/14/2015   Procedure: CYSTOSCOPY WITH RETROGRADE PYELOGRAM, URETEROSCOPY AND STENT PLACEMENT;  Surgeon: Alexis Frock, MD;  Location: [facility];  Service: Urology;  Laterality: Right;   HERNIA REPAIR      x2   HOLMIUM LASER APPLICATION Right [id number]   Procedure: HOLMIUM LASER APPLICATION;  Surgeon: Alexis Frock, MD;  Location: [facility];  Service: Urology;  Laterality: Right;   MELANOMA EXCISION      Family History  Problem Relation Age of Onset   Diabetes Sister    Kidney disease Sister   No bleeding disorders, clotting disorders or aneurysms   Social History   Tobacco Use   Smoking status: Never   Smokeless tobacco: Never  Vaping Use   Vaping Use: Never used  Substance Use Topics   Alcohol use: No    Alcohol/week: 0.0 standard drinks   Drug use: No     No Known Allergies  Current Outpatient Medications  Medication Sig Dispense Refill   amLODipine (NORVASC) 10 MG tablet TK 1 T PO QD  1   aspirin 81 MG EC tablet Take by mouth.     losartan (COZAAR) 100 MG tablet Take 100 mg by mouth daily.     omeprazole (PRILOSEC) 20 MG capsule Take 20 mg by mouth every other day.      simvastatin (ZOCOR) 20 MG tablet Take 20 mg by mouth at bedtime.      No current facility-administered medications for this visit.      REVIEW OF SYSTEMS (Negative unless checked)  Constitutional: '[]' Weight loss  '[]' Fever  '[]' Chills Cardiac: '[]' Chest pain   '[]' Chest pressure   '[]' Palpitations   '[]' Shortness of breath when laying flat   '[]' Shortness of  breath at rest   '[]' Shortness of breath with exertion. Vascular:  '[]' Pain in legs with walking   '[]' Pain in legs at rest   '[]' Pain in legs when laying flat   '[]' Claudication   '[]' Pain in feet when walking  '[]' Pain in feet at rest  '[]' Pain in feet when laying flat   '[]' History of DVT   '[]' Phlebitis   '[]' Swelling in legs   '[]' Varicose veins   '[]' Non-healing ulcers Pulmonary:   '[]' Uses home oxygen   '[]' Productive cough   '[]' Hemoptysis   '[]' Wheeze  '[]' COPD   '[]' Asthma Neurologic:  '[]' Dizziness  '[]' Blackouts   '[]' Seizures   '[]' History of stroke   '[]' History of TIA  '[]' Aphasia   '[]' Temporary blindness   '[]' Dysphagia   '[]' Weakness or numbness in arms   '[]' Weakness or numbness in legs X  positive for headaches Musculoskeletal:  '[]' Arthritis   '[]' Joint swelling   '[]' Joint pain   '[]' Low back pain Hematologic:  '[]' Easy bruising  '[]' Easy bleeding   '[]' Hypercoagulable state   '[]' Anemic  '[]' Hepatitis Gastrointestinal:  '[]' Blood in stool   '[]' Vomiting blood  '[x]' Gastroesophageal reflux/heartburn   '[]' Abdominal pain Genitourinary:  '[]' Chronic kidney disease   '[]' Difficult urination  '[x]' Frequent urination  '[]' Burning with urination   '[]' Hematuria Skin:  '[]' Rashes   '[]' Ulcers   '[]' Wounds Psychological:  '[x]' History of anxiety   '[]'  History of major depression.    Physical Exam BP 102/75   Pulse 91   Ht '5\' 8"'  (1.727 m)   Wt 169 lb (76.7 kg)   BMI 25.70 kg/m  Gen:  WD/WN, NAD Head: [place]/AT, No temporalis wasting. Ear/Nose/Throat: Hearing grossly intact, nares w/o erythema or drainage, oropharynx w/o Erythema/Exudate Eyes: Conjunctiva clear, sclera non-icteric  Neck: trachea midline.  No bruit or JVD.  Scar and deformity on the left neck Pulmonary:  Good air movement, clear to auscultation bilaterally.  Cardiac: RRR, normal S1, S2 Vascular:  Vessel Right Left  Radial Trace Palpable 2+ Palpable                       Musculoskeletal: M/S 5/5 throughout.  Extremities without ischemic changes.  No deformity or atrophy. No edema. Neurologic: Sensation grossly intact in extremities.  Symmetrical.  Speech is fluent. Motor exam as listed above. Psychiatric: Judgment intact, Mood & affect appropriate for pt's clinical situation. Dermatologic: No rashes or ulcers noted.  No cellulitis or open wounds.    Radiology CT Angio Head W or Wo Contrast  Result Date: 04/15/2021 CLINICAL DATA:  Headache EXAM: CT ANGIOGRAPHY HEAD TECHNIQUE: Multidetector CT imaging of the head was performed using the standard protocol during bolus administration of intravenous contrast. Multiplanar CT image reconstructions and MIPs were obtained to evaluate the vascular anatomy. CONTRAST:  25m OMNIPAQUE IOHEXOL 350 MG/ML SOLN  COMPARISON:  Head CT 04/15/2021 FINDINGS: POSTERIOR CIRCULATION: --Vertebral arteries: Normal --Inferior cerebellar arteries: Normal. --Basilar artery: Normal. --Superior cerebellar arteries: Normal. --Posterior cerebral arteries: Normal. ANTERIOR CIRCULATION: --Intracranial internal carotid arteries: Left internal carotid artery is occluded. This is age indeterminate. Normal right internal carotid artery aside from atherosclerotic calcification. --Anterior cerebral arteries (ACA): Normal. --Middle cerebral arteries (MCA): Normal. ANATOMIC VARIANTS: Both posterior communicating arteries are patent. Review of the MIP images confirms the above findings. IMPRESSION: 1. Age indeterminate occlusion of the left internal carotid artery. 2. Normal opacification of left middle and anterior cerebral arteries due to collateral flow across the circle-of-Willis. These results were called by telephone at the time of interpretation on 04/15/2021 at 10:35 pm to provider [facility],  who verbally acknowledged these results. Electronically Signed   By: Ulyses Jarred M.D.   On: 04/15/2021 22:35   CT HEAD WO CONTRAST  Result Date: 04/15/2021 CLINICAL DATA:  Hervey Ard shooting pains to the right parietal head lasting a few seconds at the time. EXAM: CT HEAD WITHOUT CONTRAST TECHNIQUE: Contiguous axial images were obtained from the base of the skull through the vertex without intravenous contrast. COMPARISON:  MRI brain 10/31/2009 FINDINGS: Brain: No evidence of acute infarction, hemorrhage, hydrocephalus, extra-axial collection or mass lesion/mass effect. Diffuse cerebral atrophy. Patchy low-attenuation changes in the deep white matter consistent with small vessel ischemia. Vascular: Moderate intracranial arterial vascular calcifications. Skull: Calvarium appears intact. Sinuses/Orbits: Paranasal sinuses are clear. Opacification of portions of the bilateral mastoid air cells. Other: None. IMPRESSION: No acute intracranial  abnormalities. Chronic atrophy and small vessel ischemic changes. Bilateral mastoid effusions. Electronically Signed   By: Lucienne Capers M.D.   On: 04/15/2021 20:07    Labs Recent Results (from the past 2160 hour(s))  Resp Panel by RT-PCR (Flu A&B, Covid) Nasopharyngeal Swab     Status: Abnormal   Collection Time: 03/11/21  1:24 PM   Specimen: Nasopharyngeal Swab; Nasopharyngeal(NP) swabs in vial transport medium  Result Value Ref Range   SARS Coronavirus 2 by RT PCR POSITIVE (A) NEGATIVE    Comment: RESULT CALLED TO, READ BACK BY AND VERIFIED WITH: LESLEY EAVES/1410PM Mar 11 2021/SFW (NOTE) SARS-CoV-2 target nucleic acids are DETECTED.  The SARS-CoV-2 RNA is generally detectable in upper respiratory specimens during the acute phase of infection. Positive results are indicative of the presence of the identified virus, but do not rule out bacterial infection or co-infection with other pathogens not detected by the test. Clinical correlation with patient history and other diagnostic information is necessary to determine patient infection status. The expected result is Negative.  Fact Sheet for Patients: EntrepreneurPulse.com.au  Fact Sheet for Healthcare Providers: IncredibleEmployment.be  This test is not yet approved or cleared by the [place] FDA and  has been authorized for detection and/or diagnosis of SARS-CoV-2 by FDA under an Emergency Use Authorization (EUA).  This EUA will remain in effect (meaning this test can  be used) for the duration of  the COVID-19 declaration under Section 564(b)(1) of the Act, 21 U.S.C. section 360bbb-3(b)(1), unless the authorization is terminated or revoked sooner.     Influenza A by PCR NEGATIVE NEGATIVE   Influenza B by PCR NEGATIVE NEGATIVE    Comment: (NOTE) The Xpert Xpress SARS-CoV-2/FLU/RSV plus assay is intended as an aid in the diagnosis of influenza from Nasopharyngeal swab specimens  and should not be used as a sole basis for treatment. Nasal washings and aspirates are unacceptable for Xpert Xpress SARS-CoV-2/FLU/RSV testing.  Fact Sheet for Patients: EntrepreneurPulse.com.au  Fact Sheet for Healthcare Providers: IncredibleEmployment.be  This test is not yet approved or cleared by the [place] FDA and has been authorized for detection and/or diagnosis of SARS-CoV-2 by FDA under an Emergency Use Authorization (EUA). This EUA will remain in effect (meaning this test can be used) for the duration of the COVID-19 declaration under Section 564(b)(1) of the Act, 21 U.S.C. section 360bbb-3(b)(1), unless the authorization is terminated or revoked.  Performed at [facility], [address] [place], [place] [address]   Rapid Influenza A&B Antigens     Status: None   Collection Time: 03/23/21 11:21 AM   Specimen: Flu Kit Nasopharyngeal Swab; Respiratory  Result Value Ref Range   Influenza A [facility]) NEGATIVE  NEGATIVE   Influenza B ([facility]) NEGATIVE NEGATIVE    Comment: Negative results do not exclude influenza virus infection, and influenza should still be considered if clinical suspicion is high. Performed at [facility], [address], [place], [place] [address]   CBC with Differential     Status: None   Collection Time: 04/15/21  7:19 PM  Result Value Ref Range   WBC 6.9 4.0 - 10.5 K/uL   RBC 4.33 4.22 - 5.81 MIL/uL   Hemoglobin 13.8 13.0 - 17.0 g/dL   HCT 39.4 39.0 - 52.0 %   MCV 91.0 80.0 - 100.0 fL   MCH 31.9 26.0 - 34.0 pg   MCHC 35.0 30.0 - 36.0 g/dL   RDW 12.5 11.5 - 15.5 %   Platelets 301 150 - 400 K/uL   nRBC 0.0 0.0 - 0.2 %   Neutrophils Relative % 52 %   Neutro Abs 3.6 1.7 - 7.7 K/uL   Lymphocytes Relative 34 %   Lymphs Abs 2.4 0.7 - 4.0 K/uL   Monocytes Relative 11 %   Monocytes Absolute 0.7 0.1 - 1.0 K/uL   Eosinophils Relative 2 %   Eosinophils Absolute 0.1 0.0 - 0.5 K/uL    Basophils Relative 1 %   Basophils Absolute 0.1 0.0 - 0.1 K/uL   Immature Granulocytes 0 %   Abs Immature Granulocytes 0.01 0.00 - 0.07 K/uL    Comment: Performed at [facility], [address] [place], [place] [address]  Comprehensive metabolic panel     Status: Abnormal   Collection Time: 04/15/21  7:19 PM  Result Value Ref Range   Sodium 137 135 - 145 mmol/L   Potassium 4.2 3.5 - 5.1 mmol/L   Chloride 106 98 - 111 mmol/L   CO2 23 22 - 32 mmol/L   Glucose, Bld 217 (H) 70 - 99 mg/dL    Comment: Glucose reference range applies only to samples taken after fasting for at least 8 hours.   BUN 30 (H) 8 - 23 mg/dL   Creatinine, Ser 1.82 (H) 0.61 - 1.24 mg/dL   Calcium 9.0 8.9 - 10.3 mg/dL   Total Protein 7.8 6.5 - 8.1 g/dL   Albumin 4.1 3.5 - 5.0 g/dL   AST 28 15 - 41 U/L   ALT 15 0 - 44 U/L   Alkaline Phosphatase 58 38 - 126 U/L   Total Bilirubin 0.7 0.3 - 1.2 mg/dL   GFR, Estimated 37 (L) >60 mL/min    Comment: (NOTE) Calculated using the CKD-EPI Creatinine Equation (2021)    Anion gap 8 5 - 15    Comment: Performed at [facility], [address], [place], [place] [address]    Assessment/Plan:  Internal carotid artery occlusion, left I have independently reviewed his CT angiogram of the head and he does appear to have a left internal carotid artery occlusion.  I cannot ascertain any information on the right carotid artery below the base of the skull on the CT scan, and a duplex should be done in the near future at his convenience for further evaluation.  We discussed with he and his family that no intervention or surgery is recommended for total occlusion of the carotid artery, and our care plan will be based largely on the findings on his right carotid artery.  He should continue his aspirin and statin agent  Essential (primary) hypertension blood pressure control important in reducing the progression of atherosclerotic disease. On appropriate oral  medications.  CKD (chronic kidney disease) stage 3, GFR 30-59 ml/min (HCC) Avoid contrast unless needed for intervention and hydrate with any contrast load.  Hyperlipidemia, unspecified lipid control important in reducing the progression of atherosclerotic disease. Continue statin therapy   Subclavian artery stenosis, right (HCC) The patient appears to have an asymptomatic right subclavian artery stenosis although it will be important to try to determine whether or not this could be an innominate artery stenosis that would affect his right carotid circulation as well particular given the left carotid occlusion.  Duplex in the near future at his convenience.  Continue current medical regimen.      Leotis Pain 05/12/2021, 1:35 PM   This note was created with Dragon medical transcription system.  Any errors from dictation are unintentional.

## 2021-05-12 NOTE — Assessment & Plan Note (Signed)
Avoid contrast unless needed for intervention and hydrate with any contrast load.

## 2021-05-12 NOTE — Assessment & Plan Note (Signed)
lipid control important in reducing the progression of atherosclerotic disease. Continue statin therapy  

## 2021-05-12 NOTE — Assessment & Plan Note (Signed)
I have independently reviewed his CT angiogram of the head and he does appear to have a left internal carotid artery occlusion.  I cannot ascertain any information on the right carotid artery below the base of the skull on the CT scan, and a duplex should be done in the near future at his convenience for further evaluation.  We discussed with he and his family that no intervention or surgery is recommended for total occlusion of the carotid artery, and our care plan will be based largely on the findings on his right carotid artery.  He should continue his aspirin and statin agent

## 2021-05-12 NOTE — Assessment & Plan Note (Signed)
The patient appears to have an asymptomatic right subclavian artery stenosis although it will be important to try to determine whether or not this could be an innominate artery stenosis that would affect his right carotid circulation as well particular given the left carotid occlusion.  Duplex in the near future at his convenience.  Continue current medical regimen.

## 2021-05-12 NOTE — Assessment & Plan Note (Signed)
blood pressure control important in reducing the progression of atherosclerotic disease. On appropriate oral medications.  

## 2021-06-09 ENCOUNTER — Ambulatory Visit (INDEPENDENT_AMBULATORY_CARE_PROVIDER_SITE_OTHER): Payer: Commercial Managed Care - PPO | Admitting: Vascular Surgery

## 2021-06-09 ENCOUNTER — Ambulatory Visit (INDEPENDENT_AMBULATORY_CARE_PROVIDER_SITE_OTHER): Payer: Commercial Managed Care - PPO

## 2021-06-09 ENCOUNTER — Encounter (INDEPENDENT_AMBULATORY_CARE_PROVIDER_SITE_OTHER): Payer: Self-pay | Admitting: Vascular Surgery

## 2021-06-09 ENCOUNTER — Other Ambulatory Visit: Payer: Self-pay

## 2021-06-09 VITALS — BP 146/77 | HR 84 | Resp 16 | Wt 169.6 lb

## 2021-06-09 DIAGNOSIS — E785 Hyperlipidemia, unspecified: Secondary | ICD-10-CM

## 2021-06-09 DIAGNOSIS — N183 Chronic kidney disease, stage 3 unspecified: Secondary | ICD-10-CM | POA: Diagnosis not present

## 2021-06-09 DIAGNOSIS — I771 Stricture of artery: Secondary | ICD-10-CM | POA: Diagnosis not present

## 2021-06-09 DIAGNOSIS — I6522 Occlusion and stenosis of left carotid artery: Secondary | ICD-10-CM

## 2021-06-09 DIAGNOSIS — I10 Essential (primary) hypertension: Secondary | ICD-10-CM

## 2021-06-09 NOTE — Progress Notes (Signed)
MRN : [id number]  Raymond Christian is a 80 y.o. (1941/06/13) male who presents with chief complaint of  Chief Complaint  Patient presents with   Follow-up    Ultrasound follow up  .  History of Present Illness: Patient returns in follow-up of his carotid disease.  He has had no major changes or problems since his last visit several weeks ago.  No new focal neurologic symptoms.  He has what sounds like confusion and orthostatic hypotension most mornings.  He denies any arm or leg weakness or numbness or speech or swallowing difficulty.  His duplex today shows a known occlusion of the left internal carotid artery.  His velocities fall just into the 40 to 59% range on the right and this may be falsely elevated due to compensatory flow.  His right vertebral artery is retrograde and his left vertebral artery is antegrade.  Current Outpatient Medications  Medication Sig Dispense Refill   amLODipine (NORVASC) 10 MG tablet TK 1 T PO QD  1   aspirin 81 MG EC tablet Take by mouth.     losartan (COZAAR) 100 MG tablet Take 100 mg by mouth daily.     omeprazole (PRILOSEC) 20 MG capsule Take 20 mg by mouth every other day.      simvastatin (ZOCOR) 20 MG tablet Take 20 mg by mouth at bedtime.      No current facility-administered medications for this visit.    Past Medical History:  Diagnosis Date   Anxiety 07/09/2014   Essential (primary) hypertension 07/09/2014   Gastro-esophageal reflux disease without esophagitis 07/09/2014   HLD (hyperlipidemia) 07/09/2014   Malignant melanoma ([place]) 07/09/2014   Prostate cancer ([place]) 06/2015   Prostate cancer [facility])     Past Surgical History:  Procedure Laterality Date   CYSTOSCOPY WITH RETROGRADE PYELOGRAM, URETEROSCOPY AND STENT PLACEMENT Right 09/14/2015   Procedure: CYSTOSCOPY WITH RETROGRADE PYELOGRAM, URETEROSCOPY AND STENT PLACEMENT;  Surgeon: Alexis Frock, MD;  Location: [facility];  Service: Urology;  Laterality: Right;   HERNIA REPAIR     x2    HOLMIUM LASER APPLICATION Right [id number]   Procedure: HOLMIUM LASER APPLICATION;  Surgeon: Alexis Frock, MD;  Location: [facility];  Service: Urology;  Laterality: Right;   MELANOMA EXCISION       Social History   Tobacco Use   Smoking status: Never   Smokeless tobacco: Never  Vaping Use   Vaping Use: Never used  Substance Use Topics   Alcohol use: No    Alcohol/week: 0.0 standard drinks   Drug use: No      Family History  Problem Relation Age of Onset   Diabetes Sister    Kidney disease Sister   No bleeding or clotting disorders   No Known Allergies   REVIEW OF SYSTEMS (Negative unless checked)   Constitutional: '[]'$ Weight loss  '[]'$ Fever  '[]'$ Chills Cardiac: '[]'$ Chest pain   '[]'$ Chest pressure   '[]'$ Palpitations   '[]'$ Shortness of breath when laying flat   '[]'$ Shortness of breath at rest   '[]'$ Shortness of breath with exertion. Vascular:  '[]'$ Pain in legs with walking   '[]'$ Pain in legs at rest   '[]'$ Pain in legs when laying flat   '[]'$ Claudication   '[]'$ Pain in feet when walking  '[]'$ Pain in feet at rest  '[]'$ Pain in feet when laying flat   '[]'$ History of DVT   '[]'$ Phlebitis   '[]'$ Swelling in legs   '[]'$ Varicose veins   '[]'$ Non-healing ulcers Pulmonary:   '[]'$ Uses home oxygen   '[]'$ Productive cough   '[]'$   Hemoptysis   '[]'$ Wheeze  '[]'$ COPD   '[]'$ Asthma Neurologic:  '[]'$ Dizziness  '[]'$ Blackouts   '[]'$ Seizures   '[]'$ History of stroke   '[]'$ History of TIA  '[]'$ Aphasia   '[]'$ Temporary blindness   '[]'$ Dysphagia   '[]'$ Weakness or numbness in arms   '[]'$ Weakness or numbness in legs X positive for headaches Musculoskeletal:  '[]'$ Arthritis   '[]'$ Joint swelling   '[]'$ Joint pain   '[]'$ Low back pain Hematologic:  '[]'$ Easy bruising  '[]'$ Easy bleeding   '[]'$ Hypercoagulable state   '[]'$ Anemic  '[]'$ Hepatitis Gastrointestinal:  '[]'$ Blood in stool   '[]'$ Vomiting blood  '[x]'$ Gastroesophageal reflux/heartburn   '[]'$ Abdominal pain Genitourinary:  '[]'$ Chronic kidney disease   '[]'$ Difficult urination  '[x]'$ Frequent urination  '[]'$ Burning with urination   '[]'$ Hematuria Skin:  '[]'$ Rashes   '[]'$ Ulcers    '[]'$ Wounds Psychological:  '[x]'$ History of anxiety   '[]'$  History of major depression.  Physical Examination  Vitals:   06/09/21 1131 06/09/21 1132  BP: 91/70 (!) 146/77  Pulse: 84   Resp: 16   Weight: 169 lb 9.6 oz (76.9 kg)    Body mass index is 25.79 kg/m. Gen:  WD/WN, NAD Head: [place]/AT, No temporalis wasting. Ear/Nose/Throat: Hearing grossly intact, nares w/o erythema or drainage, trachea midline Eyes: Conjunctiva clear. Sclera non-icteric Neck: Supple.  Right carotid bruit  Pulmonary:  Good air movement, equal and clear to auscultation bilaterally.  Cardiac: RRR, No JVD Vascular:  Vessel Right Left  Radial Palpable Palpable   Musculoskeletal: M/S 5/5 throughout.  No deformity or atrophy. Mild LE edema. Neurologic: CN 2-12 intact. Sensation grossly intact in extremities.  Symmetrical.  Speech is fluent. Motor exam as listed above. Psychiatric: Judgment intact, Mood & affect appropriate for pt's clinical situation. Dermatologic: No rashes or ulcers noted.  No cellulitis or open wounds.     CBC Lab Results  Component Value Date   WBC 6.9 04/15/2021   HGB 13.8 04/15/2021   HCT 39.4 04/15/2021   MCV 91.0 04/15/2021   PLT 301 04/15/2021    BMET    Component Value Date/Time   NA 137 04/15/2021 1919   NA 142 12/25/2013 0508   K 4.2 04/15/2021 1919   K 3.8 12/25/2013 0508   CL 106 04/15/2021 1919   CL 114 (H) 12/25/2013 0508   CO2 23 04/15/2021 1919   CO2 22 12/25/2013 0508   GLUCOSE 217 (H) 04/15/2021 1919   GLUCOSE 90 12/25/2013 0508   BUN 30 (H) 04/15/2021 1919   BUN 13 12/25/2013 0508   CREATININE 1.82 (H) 04/15/2021 1919   CREATININE 1.17 12/25/2013 0508   CALCIUM 9.0 04/15/2021 1919   CALCIUM 7.5 (L) 12/25/2013 0508   GFRNONAA 37 (L) 04/15/2021 1919   GFRNONAA >60 12/25/2013 0508   GFRAA 55 (L) 12/30/2018 0928   GFRAA >60 12/25/2013 0508   CrCl cannot be calculated (Patient's most recent lab result is older than the maximum 21 days allowed.).  COAG No  results found for: INR, PROTIME  Radiology No results found.   Assessment/Plan Essential (primary) hypertension blood pressure control important in reducing the progression of atherosclerotic disease. On appropriate oral medications.     CKD (chronic kidney disease) stage 3, GFR 30-59 ml/min (HCC) Avoid contrast unless needed for intervention and hydrate with any contrast load.   Hyperlipidemia, unspecified lipid control important in reducing the progression of atherosclerotic disease. Continue statin therapy  Subclavian artery stenosis, right (HCC) His duplex today shows a known occlusion of the left internal carotid artery.  His velocities fall just into the 40 to 59% range on the  right and this may be falsely elevated due to compensatory flow.  His right vertebral artery is retrograde and his left vertebral artery is antegrade.  He is not symptomatic from this at this point no intervention will be planned.  We will be following this with duplex.  Internal carotid artery occlusion, left His duplex today shows a known occlusion of the left internal carotid artery.  His velocities fall just into the 40 to 59% range on the right and this may be falsely elevated due to compensatory flow.  His right vertebral artery is retrograde and his left vertebral artery is antegrade.  No role for intervention at this time.  Recheck in 6 months with carotid duplex no change in medical regimen.    Leotis Pain, MD  06/09/2021 2:34 PM    This note was created with Dragon medical transcription system.  Any errors from dictation are purely unintentional

## 2021-06-09 NOTE — Assessment & Plan Note (Signed)
His duplex today shows a known occlusion of the left internal carotid artery.  His velocities fall just into the 40 to 59% range on the right and this may be falsely elevated due to compensatory flow.  His right vertebral artery is retrograde and his left vertebral artery is antegrade.  He is not symptomatic from this at this point no intervention will be planned.  We will be following this with duplex.

## 2021-06-09 NOTE — Assessment & Plan Note (Signed)
His duplex today shows a known occlusion of the left internal carotid artery.  His velocities fall just into the 40 to 59% range on the right and this may be falsely elevated due to compensatory flow.  His right vertebral artery is retrograde and his left vertebral artery is antegrade.  No role for intervention at this time.  Recheck in 6 months with carotid duplex no change in medical regimen.

## 2021-09-06 ENCOUNTER — Other Ambulatory Visit: Payer: Self-pay

## 2021-09-06 ENCOUNTER — Ambulatory Visit
Admission: EM | Admit: 2021-09-06 | Discharge: 2021-09-06 | Disposition: A | Discharge status: Home / Self Care | Payer: Commercial Managed Care - PPO | Attending: Emergency Medicine | Admitting: Emergency Medicine

## 2021-09-06 DIAGNOSIS — J09X2 Influenza due to identified novel influenza A virus with other respiratory manifestations: Secondary | ICD-10-CM | POA: Insufficient documentation

## 2021-09-06 LAB — RAPID INFLUENZA A&B ANTIGENS
Influenza A (ARMC): POSITIVE — AB
Influenza B (ARMC): NEGATIVE

## 2021-09-06 MED ORDER — OSELTAMIVIR PHOSPHATE 75 MG PO CAPS: 75.0000 mg | ORAL_CAPSULE | Freq: Two times a day (BID) | ORAL | 0 refills | Status: DC

## 2021-09-06 MED ORDER — IPRATROPIUM BROMIDE 0.06 % NA SOLN: 2.0000 | Freq: Four times a day (QID) | NASAL | 12 refills | Status: DC

## 2021-09-06 MED ORDER — BENZONATATE 100 MG PO CAPS: 200.0000 mg | ORAL_CAPSULE | Freq: Three times a day (TID) | ORAL | 0 refills | Status: DC

## 2021-09-06 NOTE — ED Triage Notes (Signed)
Pt here with C/O chills, hot, body aches, nasal congestion for 3 days

## 2021-09-06 NOTE — Discharge Instructions (Addendum)
Take the Tamiflu twice daily for 5 days for treatment of influenza.  Use the Atrovent nasal spray, 2 squirts up each nostril every 6 hours, as needed for nasal congestion and runny nose.  Use over-the-counter Delsym, Zarbee's, or Robitussin during the day as needed for cough.  Use the Tessalon Perles every 8 hours as needed for cough.  Taken with a small sip of water.  You may experience some numbness to your tongue or metallic taste in your mouth, this is normal.  Use over-the-counter Tylenol and ibuprofen according to package instructions as needed for fever and body aches.  Return for reevaluation, or see your primary care provider, for new or worsening symptoms.  If you develop shortness of breath or feel you cannot catch your breath please go to the ER for evaluation.

## 2021-09-06 NOTE — ED Provider Notes (Addendum)
[facility]    CSN: [id number] Arrival date & time: 09/06/21  1529      History   Chief Complaint Chief Complaint  Patient presents with   Chills    HPI Raymond Christian is a 80 y.o. male.   HPI  80 year old male here for evaluation of respiratory complaints.  Patient reports that for last 3 days he has been having cold chills, body aches, and nasal congestion with clear nasal discharge.  His cough is nonproductive and it has been responding to over-the-counter Robitussin.  He states that he feels cold.  He denies ear pain, sore throat, shortness of breath or wheezing, or GI complaints.  Patient is unaware of any sick contacts.  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   COVID-19 virus detected 03/13/2021   CKD (chronic kidney disease) stage 3, GFR 30-59 ml/min ([place]) 03/08/2020   History of prostate cancer 01/01/2019   Encounter for general adult medical examination without abnormal findings 09/02/2017   DNR (do not resuscitate) 02/11/2017   Obesity (BMI 30.0-34.9) 01/02/2017   Vaccine counseling 01/02/2017   Renal cyst Bilateral, non-complex 04/09/2016   Ureteral stone with hydronephrosis 09/13/2015   Hydronephrosis with urinary obstruction due to ureteral calculus 09/06/2015   Prostate cancer ([place]) 09/06/2015   Anxiety 07/09/2014   Essential (primary) hypertension 07/09/2014   Gastro-esophageal reflux disease without esophagitis 07/09/2014   Hyperlipidemia, unspecified 07/09/2014   Malignant melanoma ([place]) 07/09/2014   Pure  hypercholesterolemia 07/09/2014    Past Surgical History:  Procedure Laterality Date   CYSTOSCOPY WITH RETROGRADE PYELOGRAM, URETEROSCOPY AND STENT PLACEMENT Right 09/14/2015   Procedure: [place], URETEROSCOPY AND STENT PLACEMENT;  Surgeon: Alexis Frock, MD;  Location: [facility];  Service: Urology;  Laterality: Right;   HERNIA REPAIR     x2   HOLMIUM LASER APPLICATION Right 51/76/1607   Procedure: HOLMIUM LASER APPLICATION;  Surgeon: Alexis Frock, MD;  Location: [facility];  Service: Urology;  Laterality: Right;   MELANOMA EXCISION         Home Medications    Prior to Admission medications   Medication Sig Start Date End Date Taking? Authorizing Provider  amLODipine (NORVASC) 10 MG tablet TK 1 T PO QD 09/16/17  Yes [provider]  aspirin 81 MG EC tablet Take by mouth. 04/24/21  Yes [provider]  benzonatate (TESSALON) 100 MG capsule Take 2 capsules (200 mg total) by mouth every 8 (eight) hours. 09/06/21  Yes Margarette Canada, NP  ipratropium (ATROVENT) 0.06 % nasal spray Place 2 sprays into both nostrils 4 (four) times daily. 09/06/21  Yes Margarette Canada, NP  losartan (COZAAR) 100 MG tablet Take 100 mg by mouth daily.   Yes [provider]  omeprazole (PRILOSEC) 20 MG capsule Take 20 mg by mouth every other day.  06/13/15  Yes [provider]  oseltamivir (TAMIFLU) 75 MG capsule Take 1 capsule (75 mg total) by mouth every 12 (twelve) hours. 09/06/21  Yes Margarette Canada, NP  simvastatin (ZOCOR) 20 MG tablet Take 20 mg by mouth at bedtime.  06/13/15  Yes [provider]    Family History Family History  Problem Relation Age of Onset   Diabetes Sister    Kidney disease Sister     Social History Social History   Tobacco Use   Smoking status: Never   Smokeless tobacco: Never  Vaping Use   Vaping Use: Never used  Substance Use Topics   Alcohol use: No    Alcohol/week: 0.0 standard drinks   Drug use: No      Allergies   Patient has no known allergies.   Review of Systems Review of Systems  Constitutional:  Positive for chills. Negative for activity change.  HENT:  Positive for congestion and rhinorrhea. Negative for ear pain and sore throat.   Respiratory:  Positive for cough. Negative for shortness of breath and wheezing.   Gastrointestinal:  Negative for diarrhea, nausea and vomiting.  Musculoskeletal:  Positive for arthralgias and myalgias.  Skin:  Negative for rash.  Hematological: Negative.   Psychiatric/Behavioral: Negative.      Physical Exam Triage Vital Signs ED Triage Vitals  Enc Vitals Group     BP 09/06/21 1632 (!) 142/80     Pulse Rate 09/06/21 1632 90     Resp 09/06/21 1632 18     Temp 09/06/21 1632 99.5 F (37.5 C)     Temp Source 09/06/21 1632 Oral     SpO2 09/06/21 1632 98 %     Weight 09/06/21 1631 176 lb (79.8 kg)     Height 09/06/21 1631 5\' 8"  (1.727 m)     Head Circumference --      Peak Flow --      Pain Score 09/06/21 1631 0     Pain Loc --      Pain Edu? --      Excl. in [place]? --    No data found.  Updated Vital Signs BP (!) 142/80 (BP Location: Left Arm)   Pulse 90   Temp 99.5 F (37.5 C) (Oral)   Resp 18   Ht 5\' 8"  (1.727 m)   Wt 176 lb (79.8 kg)   SpO2 98%   BMI 26.76 kg/m   Visual Acuity Right Eye Distance:   Left Eye Distance:   Bilateral Distance:    Right Eye Near:   Left Eye Near:    Bilateral Near:     Physical Exam Vitals and nursing note reviewed.  Constitutional:      General: He is not in acute distress.    Appearance: Normal appearance. He is normal weight. He is not ill-appearing.  HENT:     Head: Normocephalic and atraumatic.     Right Ear: Tympanic membrane, ear canal and external ear normal. There is no impacted cerumen.     Left Ear: Tympanic membrane, ear canal and external ear normal. There is no impacted cerumen.     Nose: Congestion and rhinorrhea present.     Mouth/Throat:     Mouth: Mucous  membranes are moist.     Pharynx: Oropharynx is clear. No posterior oropharyngeal erythema.  Cardiovascular:     Rate and Rhythm: Normal rate and regular rhythm.     Pulses: Normal pulses.     Heart sounds: Normal heart sounds. No murmur heard.   No gallop.  Pulmonary:     Effort: Pulmonary effort is normal.     Breath sounds: Normal breath sounds. No wheezing, rhonchi or rales.  Musculoskeletal:     Cervical back: Normal range of motion and neck supple.  Lymphadenopathy:  Cervical: No cervical adenopathy.  Skin:    General: Skin is warm and dry.     Capillary Refill: Capillary refill takes less than 2 seconds.     Findings: No erythema or rash.  Neurological:     General: No focal deficit present.     Mental Status: He is alert and oriented to person, place, and time.  Psychiatric:        Mood and Affect: Mood normal.        Behavior: Behavior normal.        Thought Content: Thought content normal.        Judgment: Judgment normal.     UC Treatments / Results  Labs (all labs ordered are listed, but only abnormal results are displayed) Labs Reviewed  RAPID INFLUENZA A&B ANTIGENS - Abnormal; Notable for the following components:      Result Value   Influenza A ([facility]) POSITIVE (*)    All other components within normal limits    EKG   Radiology No results found.  Procedures Procedures (including critical care time)  Medications Ordered in UC Medications - No data to display  Initial Impression / Assessment and Plan / UC Course  I have reviewed the triage vital signs and the nursing notes.  Pertinent labs & imaging results that were available during my care of the patient were reviewed by me and considered in my medical decision making (see chart for details).  Patient is a very pleasant, nontoxic-appearing 80 year old male here for evaluation of respiratory complaints as outlined in HPI above.  Patient's physical exam reveals pearly gray tympanic membranes  bilaterally with normal light reflex and clear external auditory canals.  Nasal mucosa is erythematous and edematous with clear nasal discharge in both nares.  Oropharyngeal exam reveals clear postnasal drip without any significant erythema of the posterior oropharynx.  No cervical lymphadenopathy appreciated on exam.  Cardiopulmonary exam reveals clear lung sounds in all fields.  Rapid influenza swab collected at triage and patient is positive for influenza A.  Will discharge patient home on Tamiflu 75 mg twice daily, Atrovent nasal spray to help with a runny nose and nasal congestion, and Tessalon Perles that patient can use to augment the over-the-counter Robitussin as needed.  ER and return precautions reviewed with patient.   Final Clinical Impressions(s) / UC Diagnoses   Final diagnoses:  Influenza due to identified novel influenza A virus with other respiratory manifestations     Discharge Instructions      Take the Tamiflu twice daily for 5 days for treatment of influenza.  Use the Atrovent nasal spray, 2 squirts up each nostril every 6 hours, as needed for nasal congestion and runny nose.  Use over-the-counter Delsym, Zarbee's, or Robitussin during the day as needed for cough.  Use the Tessalon Perles every 8 hours as needed for cough.  Taken with a small sip of water.  You may experience some numbness to your tongue or metallic taste in your mouth, this is normal.  Use over-the-counter Tylenol and ibuprofen according to package instructions as needed for fever and body aches.  Return for reevaluation, or see your primary care provider, for new or worsening symptoms.  If you develop shortness of breath or feel you cannot catch your breath please go to the ER for evaluation.     ED Prescriptions     Medication Sig Dispense Auth. Provider   oseltamivir (TAMIFLU) 75 MG capsule Take 1 capsule (75 mg total) by mouth every 12 (  twelve) hours. 10 capsule Margarette Canada, NP    benzonatate (TESSALON) 100 MG capsule Take 2 capsules (200 mg total) by mouth every 8 (eight) hours. 21 capsule Margarette Canada, NP   ipratropium (ATROVENT) 0.06 % nasal spray Place 2 sprays into both nostrils 4 (four) times daily. 15 mL Margarette Canada, NP      PDMP not reviewed this encounter.   Margarette Canada, NP 09/06/21 1717    Margarette Canada, NP 09/06/21 1720

## 2021-09-14 DIAGNOSIS — Z862 Personal history of diseases of the blood and blood-forming organs and certain disorders involving the immune mechanism: Secondary | ICD-10-CM | POA: Insufficient documentation

## 2021-12-08 ENCOUNTER — Ambulatory Visit (INDEPENDENT_AMBULATORY_CARE_PROVIDER_SITE_OTHER): Payer: Commercial Managed Care - PPO

## 2021-12-08 ENCOUNTER — Other Ambulatory Visit: Payer: Self-pay

## 2021-12-08 ENCOUNTER — Ambulatory Visit (INDEPENDENT_AMBULATORY_CARE_PROVIDER_SITE_OTHER): Payer: Commercial Managed Care - PPO | Admitting: Nurse Practitioner

## 2021-12-08 VITALS — BP 135/75 | HR 84 | Ht 68.0 in | Wt 170.0 lb

## 2021-12-08 DIAGNOSIS — I6522 Occlusion and stenosis of left carotid artery: Secondary | ICD-10-CM

## 2021-12-08 DIAGNOSIS — I10 Essential (primary) hypertension: Secondary | ICD-10-CM | POA: Diagnosis not present

## 2021-12-08 DIAGNOSIS — E785 Hyperlipidemia, unspecified: Secondary | ICD-10-CM

## 2021-12-08 DIAGNOSIS — I771 Stricture of artery: Secondary | ICD-10-CM

## 2021-12-17 ENCOUNTER — Encounter (INDEPENDENT_AMBULATORY_CARE_PROVIDER_SITE_OTHER): Payer: Self-pay | Admitting: Nurse Practitioner

## 2021-12-17 NOTE — Progress Notes (Signed)
Subjective:    Patient ID: Raymond Christian, male    DOB: 04/29/1941, 81 y.o.   MRN: [id number] Chief Complaint  Patient presents with   Follow-up    6 mo Carotid    Patient returns in follow-up of his carotid disease.  He has had no major changes or problems since his last visit several months ago.  No new focal neurologic symptoms.   He denies any arm or leg weakness or numbness or speech or swallowing difficulty.  His duplex today shows a known occlusion of the left internal carotid artery.  His velocities fall just into the 1 to 39% range on the right  His right vertebral artery is retrograde and his left vertebral artery is antegrade.   Review of Systems  All other systems reviewed and are negative.     Objective:   Physical Exam Vitals reviewed.  HENT:     Head: Normocephalic.  Cardiovascular:     Rate and Rhythm: Normal rate.     Pulses: Normal pulses.  Pulmonary:     Effort: Pulmonary effort is normal.  Skin:    General: Skin is warm and dry.  Neurological:     Mental Status: He is alert and oriented to person, place, and time.  Psychiatric:        Mood and Affect: Mood normal.        Behavior: Behavior normal.        Thought Content: Thought content normal.        Judgment: Judgment normal.    BP 135/75    Pulse 84    Ht 5\' 8"  (1.727 m)    Wt 170 lb (77.1 kg)    BMI 25.85 kg/m   Past Medical History:  Diagnosis Date   Anxiety 07/09/2014   Essential (primary) hypertension 07/09/2014   Gastro-esophageal reflux disease without esophagitis 07/09/2014   HLD (hyperlipidemia) 07/09/2014   Malignant melanoma ([place]) 07/09/2014   Prostate cancer ([place]) 06/2015   Prostate cancer [facility])     Social History   Socioeconomic History   Marital status: Married    Spouse name: Not on file   Number of children: Not on file   Years of education: Not on file   Highest education level: Not on file  Occupational History   Not on file  Tobacco Use   Smoking status: Never    Smokeless tobacco: Never  Vaping Use   Vaping Use: Never used  Substance and Sexual Activity   Alcohol use: No    Alcohol/week: 0.0 standard drinks   Drug use: No   Sexual activity: Not on file  Other Topics Concern   Not on file  Social History Narrative   Not on file   Social Determinants of Health   Financial Resource Strain: Not on file  Food Insecurity: Not on file  Transportation Needs: Not on file  Physical Activity: Not on file  Stress: Not on file  Social Connections: Not on file  Intimate Partner Violence: Not on file    Past Surgical History:  Procedure Laterality Date   CYSTOSCOPY WITH RETROGRADE PYELOGRAM, URETEROSCOPY AND STENT PLACEMENT Right 09/14/2015   Procedure: [place], URETEROSCOPY AND STENT PLACEMENT;  Surgeon: Alexis Frock, MD;  Location: [facility];  Service: Urology;  Laterality: Right;   HERNIA REPAIR     x2   HOLMIUM LASER APPLICATION Right 62/83/6629   Procedure: HOLMIUM LASER APPLICATION;  Surgeon: Alexis Frock, MD;  Location: [facility];  Service:  Urology;  Laterality: Right;   MELANOMA EXCISION      Family History  Problem Relation Age of Onset   Diabetes Sister    Kidney disease Sister     No Known Allergies  CBC Latest Ref Rng & Units 04/15/2021 12/30/2018 02/06/2017  WBC 4.0 - 10.5 K/uL 6.9 4.0 5.1  Hemoglobin 13.0 - 17.0 g/dL 13.8 14.0 15.3  Hematocrit 39.0 - 52.0 % 39.4 41.2 43.8  Platelets 150 - 400 K/uL 301 192 198      CMP     Component Value Date/Time   NA 137 04/15/2021 1919   NA 142 12/25/2013 0508   K 4.2 04/15/2021 1919   K 3.8 12/25/2013 0508   CL 106 04/15/2021 1919   CL 114 (H) 12/25/2013 0508   CO2 23 04/15/2021 1919   CO2 22 12/25/2013 0508   GLUCOSE 217 (H) 04/15/2021 1919   GLUCOSE 90 12/25/2013 0508   BUN 30 (H) 04/15/2021 1919   BUN 13 12/25/2013 0508   CREATININE 1.82 (H) 04/15/2021 1919   CREATININE 1.17 12/25/2013 0508   CALCIUM 9.0 04/15/2021 1919   CALCIUM 7.5 (L)  12/25/2013 0508   PROT 7.8 04/15/2021 1919   PROT 9.0 (H) 12/22/2013 2232   ALBUMIN 4.1 04/15/2021 1919   ALBUMIN 4.7 12/22/2013 2232   AST 28 04/15/2021 1919   AST 38 (H) 12/22/2013 2232   ALT 15 04/15/2021 1919   ALT 45 12/22/2013 2232   ALKPHOS 58 04/15/2021 1919   ALKPHOS 70 12/22/2013 2232   BILITOT 0.7 04/15/2021 1919   BILITOT 0.9 12/22/2013 2232   GFRNONAA 37 (L) 04/15/2021 1919   GFRNONAA >60 12/25/2013 0508   GFRAA 55 (L) 12/30/2018 0928   GFRAA >60 12/25/2013 0508     No results found.     Assessment & Plan:   1. Internal carotid artery occlusion, left His duplex today shows a known occlusion of the left internal carotid artery.  His velocities fall just into the 1-39% range on the right and the previous 40 to 59% velocities may have been falsely elevated due to compensatory flow.  His right vertebral artery is retrograde and his left vertebral artery is antegrade.  No role for intervention at this time.  Recheck in 6 months with carotid duplex no change in medical regimen.  2. Subclavian artery stenosis, right ([facility]) His duplex today shows a known occlusion of the left internal carotid artery.  His velocities fall just into the 1-39% range on the right and the previous 40 to 59% velocities may have been falsely elevated due to compensatory flow.  His right vertebral artery is retrograde and his left vertebral artery is antegrade.  No role for intervention at this time.  Recheck in 6 months with carotid duplex no change in medical regimen.  3. Essential (primary) hypertension Continue antihypertensive medications as already ordered, these medications have been reviewed and there are no changes at this time.   4. Hyperlipidemia, unspecified hyperlipidemia type Continue statin as ordered and reviewed, no changes at this time    Current Outpatient Medications on File Prior to Visit  Medication Sig Dispense Refill   amLODipine (NORVASC) 10 MG tablet TK 1 T PO QD  1    aspirin 81 MG EC tablet Take by mouth.     benzonatate (TESSALON) 100 MG capsule Take 2 capsules (200 mg total) by mouth every 8 (eight) hours. 21 capsule 0   ipratropium (ATROVENT) 0.06 % nasal spray Place 2 sprays into both  nostrils 4 (four) times daily. 15 mL 12   losartan (COZAAR) 100 MG tablet Take 100 mg by mouth daily.     omeprazole (PRILOSEC) 20 MG capsule Take 20 mg by mouth every other day.      oseltamivir (TAMIFLU) 75 MG capsule Take 1 capsule (75 mg total) by mouth every 12 (twelve) hours. 10 capsule 0   simvastatin (ZOCOR) 20 MG tablet Take 20 mg by mouth at bedtime.      No current facility-administered medications on file prior to visit.    There are no Patient Instructions on file for this visit. No follow-ups on file.   Kris Hartmann, NP

## 2022-06-11 ENCOUNTER — Other Ambulatory Visit (INDEPENDENT_AMBULATORY_CARE_PROVIDER_SITE_OTHER): Payer: Self-pay | Admitting: Vascular Surgery

## 2022-06-11 DIAGNOSIS — I6523 Occlusion and stenosis of bilateral carotid arteries: Secondary | ICD-10-CM

## 2022-06-12 ENCOUNTER — Ambulatory Visit (INDEPENDENT_AMBULATORY_CARE_PROVIDER_SITE_OTHER): Payer: BLUE CROSS/BLUE SHIELD

## 2022-06-12 ENCOUNTER — Ambulatory Visit (INDEPENDENT_AMBULATORY_CARE_PROVIDER_SITE_OTHER): Payer: BLUE CROSS/BLUE SHIELD | Admitting: Vascular Surgery

## 2022-06-12 ENCOUNTER — Encounter (INDEPENDENT_AMBULATORY_CARE_PROVIDER_SITE_OTHER): Payer: Self-pay | Admitting: Vascular Surgery

## 2022-06-12 VITALS — BP 123/80 | HR 90 | Resp 16 | Wt 170.0 lb

## 2022-06-12 DIAGNOSIS — I6522 Occlusion and stenosis of left carotid artery: Secondary | ICD-10-CM | POA: Diagnosis not present

## 2022-06-12 DIAGNOSIS — I10 Essential (primary) hypertension: Secondary | ICD-10-CM

## 2022-06-12 DIAGNOSIS — N183 Chronic kidney disease, stage 3 unspecified: Secondary | ICD-10-CM

## 2022-06-12 DIAGNOSIS — I771 Stricture of artery: Secondary | ICD-10-CM

## 2022-06-12 DIAGNOSIS — E785 Hyperlipidemia, unspecified: Secondary | ICD-10-CM

## 2022-06-12 DIAGNOSIS — I6523 Occlusion and stenosis of bilateral carotid arteries: Secondary | ICD-10-CM

## 2022-06-12 NOTE — Progress Notes (Signed)
MRN : [id number]  Raymond Christian is a 81 y.o. (1941-08-04) male who presents with chief complaint of  Chief Complaint  Patient presents with   Follow-up    Ultrasound follow up  .  History of Present Illness: Patient returns in follow-up of his carotid disease.  He is doing well today.  He has what sounds like some vestibular issues with imbalance and dizziness but no focal neurologic symptoms. Specifically, the patient denies amaurosis fugax, speech or swallowing difficulties, or arm or leg weakness or numbness. Carotid duplex reveals stable velocities just into the 40 to 59% range on the right and a known left carotid artery occlusion.  No significant progression from previous studies  Current Outpatient Medications  Medication Sig Dispense Refill   amLODipine (NORVASC) 10 MG tablet Take 1 tablet by mouth daily.     aspirin 81 MG EC tablet Take by mouth.     losartan (COZAAR) 100 MG tablet Take 100 mg by mouth daily.     omeprazole (PRILOSEC) 20 MG capsule Take 20 mg by mouth every other day.      simvastatin (ZOCOR) 20 MG tablet Take 20 mg by mouth at bedtime.      benzonatate (TESSALON) 100 MG capsule Take 2 capsules (200 mg total) by mouth every 8 (eight) hours. (Patient not taking: Reported on 06/12/2022) 21 capsule 0   ipratropium (ATROVENT) 0.06 % nasal spray Place 2 sprays into both nostrils 4 (four) times daily. (Patient not taking: Reported on 06/12/2022) 15 mL 12   oseltamivir (TAMIFLU) 75 MG capsule Take 1 capsule (75 mg total) by mouth every 12 (twelve) hours. (Patient not taking: Reported on 06/12/2022) 10 capsule 0   No current facility-administered medications for this visit.    Past Medical History:  Diagnosis Date   Anxiety 07/09/2014   Essential (primary) hypertension 07/09/2014   Gastro-esophageal reflux disease without esophagitis 07/09/2014   HLD (hyperlipidemia) 07/09/2014   Malignant melanoma ([place]) 07/09/2014   Prostate cancer ([place]) 06/2015   Prostate cancer  [facility])     Past Surgical History:  Procedure Laterality Date   CYSTOSCOPY WITH RETROGRADE PYELOGRAM, URETEROSCOPY AND STENT PLACEMENT Right 09/14/2015   Procedure: CYSTOSCOPY WITH RETROGRADE PYELOGRAM, URETEROSCOPY AND STENT PLACEMENT;  Surgeon: Alexis Frock, MD;  Location: [facility];  Service: Urology;  Laterality: Right;   HERNIA REPAIR     x2   HOLMIUM LASER APPLICATION Right 63/87/5643   Procedure: HOLMIUM LASER APPLICATION;  Surgeon: Alexis Frock, MD;  Location: [facility];  Service: Urology;  Laterality: Right;   MELANOMA EXCISION      Social History         Tobacco Use   Smoking status: Never   Smokeless tobacco: Never  Vaping Use   Vaping Use: Never used  Substance Use Topics   Alcohol use: No      Alcohol/week: 0.0 standard drinks   Drug use: No               Family History  Problem Relation Age of Onset   Diabetes Sister     Kidney disease Sister    No bleeding or clotting disorders     No Known Allergies     REVIEW OF SYSTEMS (Negative unless checked)   Constitutional: '[]'$ Weight loss  '[]'$ Fever  '[]'$ Chills Cardiac: '[]'$ Chest pain   '[]'$ Chest pressure   '[]'$ Palpitations   '[]'$ Shortness of breath when laying flat   '[]'$ Shortness of breath at rest   '[]'$ Shortness of breath with exertion. Vascular:  '[]'$   Pain in legs with walking   '[]'$ Pain in legs at rest   '[]'$ Pain in legs when laying flat   '[]'$ Claudication   '[]'$ Pain in feet when walking  '[]'$ Pain in feet at rest  '[]'$ Pain in feet when laying flat   '[]'$ History of DVT   '[]'$ Phlebitis   '[]'$ Swelling in legs   '[]'$ Varicose veins   '[]'$ Non-healing ulcers Pulmonary:   '[]'$ Uses home oxygen   '[]'$ Productive cough   '[]'$ Hemoptysis   '[]'$ Wheeze  '[]'$ COPD   '[]'$ Asthma Neurologic:  '[x]'$ Dizziness  '[]'$ Blackouts   '[]'$ Seizures   '[]'$ History of stroke   '[]'$ History of TIA  '[]'$ Aphasia   '[]'$ Temporary blindness   '[]'$ Dysphagia   '[]'$ Weakness or numbness in arms   '[]'$ Weakness or numbness in legs X positive for headaches Musculoskeletal:  '[]'$ Arthritis   '[]'$ Joint swelling   '[]'$ Joint pain   '[]'$ Low back  pain Hematologic:  '[]'$ Easy bruising  '[]'$ Easy bleeding   '[]'$ Hypercoagulable state   '[]'$ Anemic  '[]'$ Hepatitis Gastrointestinal:  '[]'$ Blood in stool   '[]'$ Vomiting blood  '[x]'$ Gastroesophageal reflux/heartburn   '[]'$ Abdominal pain Genitourinary:  '[]'$ Chronic kidney disease   '[]'$ Difficult urination  '[x]'$ Frequent urination  '[]'$ Burning with urination   '[]'$ Hematuria Skin:  '[]'$ Rashes   '[]'$ Ulcers   '[]'$ Wounds Psychological:  '[x]'$ History of anxiety   '[]'$  History of major depression.  Physical Examination  Vitals:   06/12/22 1336  BP: 123/80  Pulse: 90  Resp: 16  Weight: 170 lb (77.1 kg)   Body mass index is 25.85 kg/m. Gen:  WD/WN, NAD.  Appears younger than stated age Head: [place]/AT, No temporalis wasting. Ear/Nose/Throat: Hearing grossly intact, nares w/o erythema or drainage, trachea midline Eyes: Conjunctiva clear. Sclera non-icteric Neck: Supple.  No bruit  Pulmonary:  Good air movement, equal and clear to auscultation bilaterally.  Cardiac: RRR, No JVD Vascular:  Vessel Right Left  Radial Palpable Palpable           Musculoskeletal: M/S 5/5 throughout.  No deformity or atrophy.  Trace lower extremity edema. Neurologic: CN 2-12 intact. Sensation grossly intact in extremities.  Symmetrical.  Speech is fluent. Motor exam as listed above. Psychiatric: Judgment intact, Mood & affect appropriate for pt's clinical situation. Dermatologic: No rashes or ulcers noted.  No cellulitis or open wounds.     CBC Lab Results  Component Value Date   WBC 6.9 04/15/2021   HGB 13.8 04/15/2021   HCT 39.4 04/15/2021   MCV 91.0 04/15/2021   PLT 301 04/15/2021    BMET    Component Value Date/Time   NA 137 04/15/2021 1919   NA 142 12/25/2013 0508   K 4.2 04/15/2021 1919   K 3.8 12/25/2013 0508   CL 106 04/15/2021 1919   CL 114 (H) 12/25/2013 0508   CO2 23 04/15/2021 1919   CO2 22 12/25/2013 0508   GLUCOSE 217 (H) 04/15/2021 1919   GLUCOSE 90 12/25/2013 0508   BUN 30 (H) 04/15/2021 1919   BUN 13 12/25/2013 0508    CREATININE 1.82 (H) 04/15/2021 1919   CREATININE 1.17 12/25/2013 0508   CALCIUM 9.0 04/15/2021 1919   CALCIUM 7.5 (L) 12/25/2013 0508   GFRNONAA 37 (L) 04/15/2021 1919   GFRNONAA >60 12/25/2013 0508   GFRAA 55 (L) 12/30/2018 0928   GFRAA >60 12/25/2013 0508   CrCl cannot be calculated (Patient's most recent lab result is older than the maximum 21 days allowed.).  COAG No results found for: "INR", "PROTIME"  Radiology No results found.   Assessment/Plan Essential (primary) hypertension blood pressure control important in reducing the progression  of atherosclerotic disease. On appropriate oral medications.     CKD (chronic kidney disease) stage 3, GFR 30-59 ml/min (HCC) Avoid contrast unless needed for intervention and hydrate with any contrast load.   Hyperlipidemia, unspecified lipid control important in reducing the progression of atherosclerotic disease. Continue statin therapy  Internal carotid artery occlusion, left Carotid duplex reveals stable velocities just into the 40 to 59% range on the right and a known left carotid artery occlusion.  No significant progression from previous studies.  No role for intervention.  Recheck in 1 year.    Leotis Pain, MD  06/12/2022 2:14 PM    This note was created with Dragon medical transcription system.  Any errors from dictation are purely unintentional

## 2022-06-12 NOTE — Assessment & Plan Note (Signed)
Carotid duplex reveals stable velocities just into the 40 to 59% range on the right and a known left carotid artery occlusion.  No significant progression from previous studies.  No role for intervention.  Recheck in 1 year.

## 2023-04-24 ENCOUNTER — Ambulatory Visit
Admission: EM | Admit: 2023-04-24 | Discharge: 2023-04-24 | Disposition: A | Discharge status: Home / Self Care | Payer: BLUE CROSS/BLUE SHIELD | Attending: Physician Assistant | Admitting: Physician Assistant

## 2023-04-24 ENCOUNTER — Encounter: Payer: Self-pay | Admitting: Emergency Medicine

## 2023-04-24 ENCOUNTER — Ambulatory Visit (INDEPENDENT_AMBULATORY_CARE_PROVIDER_SITE_OTHER): Payer: BLUE CROSS/BLUE SHIELD

## 2023-04-24 DIAGNOSIS — J069 Acute upper respiratory infection, unspecified: Secondary | ICD-10-CM

## 2023-04-24 MED ORDER — IPRATROPIUM BROMIDE 0.06 % NA SOLN: 2.0000 | Freq: Four times a day (QID) | NASAL | 0 refills | Status: AC

## 2023-04-24 MED ORDER — PROMETHAZINE-DM 6.25-15 MG/5ML PO SYRP: 5.0000 mL | ORAL_SOLUTION | Freq: Every evening | ORAL | 0 refills | Status: AC | PRN

## 2023-04-24 MED ORDER — BENZONATATE 200 MG PO CAPS: 200.0000 mg | ORAL_CAPSULE | Freq: Three times a day (TID) | ORAL | 0 refills | Status: AC | PRN

## 2023-04-24 NOTE — Discharge Instructions (Signed)
URI/COLD SYMPTOMS: Your exam today is consistent with a viral illness. Antibiotics are not indicated at this time. Use medications as directed, including cough syrup, nasal saline, and decongestants. Your symptoms should improve over the next few days and resolve within 7-10 days. Increase rest and fluids. F/u if symptoms worsen or predominate such as sore throat, ear pain, productive cough, shortness of breath, or if you develop high fevers or worsening fatigue over the next several days.    

## 2023-04-24 NOTE — ED Triage Notes (Signed)
Pt presents with a non-productive cough x 2 weeks. Pt was seen by his PCP on 04/18/23 and was prescribed a z-pak and cough syrup. He could not take the cough syrup because it made him sick.

## 2023-04-24 NOTE — ED Provider Notes (Signed)
[facility]    CSN: [id number] Arrival date & time: 04/24/23  1157      History   Chief Complaint Chief Complaint  Patient presents with   Cough    HPI Raymond Christian is a 82 y.o. male presenting for 10-day history of dry cough, nasal congestion and drainage.  Denies sinus pain, sore throat, chest pain, shortness of breath.  Patient was seen by PCP on 04/17/2023.  He was given azithromycin and codeine cough medication.  Reports he recently threw the codeine cough medication away because of it him sick.  He only has 3 tablets of azithromycin left.  He presents today because he is not really feeling any better.  Denies feeling worse.  Denies any history of COPD or asthma.  His wife was sick with similar symptoms around the time he became ill.  She is now better but still coughing a little.  No known COVID exposure.  No other complaints.  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   Internal carotid artery occlusion, left 04/24/2021   COVID-19 virus detected 03/13/2021   CKD (chronic kidney disease) stage 3, GFR 30-59 ml/min (HCC) 03/08/2020   History of prostate cancer 01/01/2019   Encounter for general adult medical examination without abnormal findings 09/02/2017   DNR (do not resuscitate) 02/11/2017   Obesity (BMI 30.0-34.9) 01/02/2017   Vaccine counseling 01/02/2017   Renal cyst Bilateral, non-complex 04/09/2016   Ureteral stone with hydronephrosis 09/13/2015   Hydronephrosis with urinary obstruction due to ureteral calculus 09/06/2015    Prostate cancer (HCC) 09/06/2015   Anxiety 07/09/2014   Essential (primary) hypertension 07/09/2014   Gastro-esophageal reflux disease without esophagitis 07/09/2014   Hyperlipidemia, unspecified 07/09/2014   Malignant melanoma (HCC) 07/09/2014   Pure hypercholesterolemia 07/09/2014    Past Surgical History:  Procedure Laterality Date   CYSTOSCOPY WITH RETROGRADE PYELOGRAM, URETEROSCOPY AND STENT PLACEMENT Right 09/14/2015   Procedure: CYSTOSCOPY WITH RETROGRADE PYELOGRAM, URETEROSCOPY AND STENT PLACEMENT;  Surgeon: Sebastian Ache, MD;  Location: [facility];  Service: Urology;  Laterality: Right;   HERNIA REPAIR     x2   HOLMIUM LASER APPLICATION Right 09/14/2015   Procedure: HOLMIUM LASER APPLICATION;  Surgeon: Sebastian Ache, MD;  Location: [facility];  Service: Urology;  Laterality: Right;   MELANOMA EXCISION         Home Medications    Prior to Admission medications   Medication Sig Start Date End Date Taking? Authorizing Provider  benzonatate (TESSALON) 200 MG capsule Take 1 capsule (200 mg total) by mouth 3 (three) times daily as needed for cough. 04/24/23  Yes Eusebio Friendly B, PA-C  ipratropium (ATROVENT) 0.06 % nasal spray Place 2 sprays into both nostrils 4 (four) times daily. 04/24/23  Yes Shirlee Latch, PA-C  promethazine-dextromethorphan (PROMETHAZINE-DM) 6.25-15 MG/5ML syrup Take 5 mLs by mouth at bedtime as needed for cough. 04/24/23  Yes Eusebio Friendly B, PA-C  amLODipine (NORVASC) 10 MG tablet Take 1 tablet by mouth daily. 03/20/17   [provider]  aspirin 81 MG EC tablet Take by mouth. 04/24/21   [provider]  losartan (COZAAR) 100 MG tablet Take 100 mg by mouth daily.    [provider]  omeprazole (PRILOSEC) 20 MG capsule Take 20 mg by mouth every other day.  06/13/15   [provider]  simvastatin (ZOCOR) 20 MG tablet Take 20 mg by mouth at bedtime.  06/13/15   [provider]    Family History Family History  Problem  Relation Age of Onset   Diabetes Sister    Kidney disease Sister     Social History Social History   Tobacco Use   Smoking status: Never   Smokeless tobacco: Never  Vaping Use   Vaping Use: Never used  Substance Use Topics   Alcohol use: No    Alcohol/week: 0.0 standard drinks of alcohol   Drug use: No     Allergies   Patient has no known allergies.   Review of Systems Review of Systems  Constitutional:  Negative for fatigue and fever.  HENT:  Positive for congestion, postnasal drip and rhinorrhea. Negative for sinus pressure, sinus pain and sore throat.   Respiratory:  Positive for cough. Negative for shortness of breath.   Cardiovascular:  Negative for chest pain.  Gastrointestinal:  Negative for abdominal pain, diarrhea, nausea and vomiting.  Musculoskeletal:  Negative for myalgias.  Neurological:  Negative for weakness, light-headedness and headaches.  Hematological:  Negative for adenopathy.     Physical Exam Triage Vital Signs ED Triage Vitals  Enc Vitals Group     BP      Pulse      Resp      Temp      Temp src      SpO2      Weight      Height      Head Circumference      Peak Flow      Pain Score      Pain Loc      Pain Edu?      Excl. in GC?    No data found.  Updated Vital Signs BP 138/84 (BP Location: Left Arm)   Pulse 85   Temp 98.4 F (36.9 C) (Oral)   Resp 16   SpO2 95%     Physical Exam Vitals and nursing note reviewed.  Constitutional:      General: He is not in acute distress.    Appearance: Normal appearance. He is well-developed. He is not ill-appearing.  HENT:     Head: Normocephalic and atraumatic.     Nose: Congestion and rhinorrhea (clear) present.     Mouth/Throat:     Mouth: Mucous membranes are moist.     Pharynx: Oropharynx is clear.  Eyes:     General: No scleral icterus.    Conjunctiva/sclera: Conjunctivae normal.  Cardiovascular:     Rate and Rhythm: Normal rate and regular rhythm.     Heart sounds:  Normal heart sounds.  Pulmonary:     Effort: Pulmonary effort is normal. No respiratory distress.     Breath sounds: Normal breath sounds. No wheezing, rhonchi or rales.  Musculoskeletal:     Cervical back: Neck supple.  Skin:    General: Skin is warm and dry.     Capillary Refill: Capillary refill takes less than 2 seconds.  Neurological:     General: No focal deficit present.     Mental Status: He is alert. Mental status is at baseline.     Motor: No weakness.     Gait: Gait  normal.  Psychiatric:        Mood and Affect: Mood normal.        Behavior: Behavior normal.      UC Treatments / Results  Labs (all labs ordered are listed, but only abnormal results are displayed) Labs Reviewed - No data to display  EKG   Radiology DG Chest 2 View  Result Date: 04/24/2023 CLINICAL DATA:  Cough EXAM: CHEST - 2 VIEW COMPARISON:  03/23/2021 FINDINGS: The heart size and mediastinal contours are within normal limits. Aortic atherosclerosis. Hiatal hernia. No focal airspace consolidation, pleural effusion, or pneumothorax. The visualized skeletal structures are unremarkable. IMPRESSION: No active cardiopulmonary disease. Electronically Signed   By: Duanne Guess D.O.   On: 04/24/2023 13:16    Procedures Procedures (including critical care time)  Medications Ordered in UC Medications - No data to display  Initial Impression / Assessment and Plan / UC Course  I have reviewed the triage vital signs and the nursing notes.  Pertinent labs & imaging results that were available during my care of the patient were reviewed by me and considered in my medical decision making (see chart for details).   82 year old male presents for 10-day history of dry cough, congestion.  Patient seen 1 week ago by PCP and diagnosed with acute URI.  Has taken azithromycin and codeine cough medication.  Not reporting feeling any better.  Vitals are all normal and stable and the patient was overall  well-appearing.  On exam he has nasal congestion with clear drainage chest is clear to auscultation.  X-ray obtained to rule out pneumonia.  X-ray is negative.  Discussed results with patient.  Suspect acute viral upper respiratory infection.  Advised patient to finish the azithromycin.  Will have him start benzonatate during the day and Promethazine DM at nighttime as needed.  Encouraged him to increase his rest and fluids and explained that he may still feel ill for the next week or 2 but if he develops a fever, worsening cough, shortness of breath or any worsening of symptoms he should return for reevaluation or follow-up with primary care provider.  ED precautions discussed.   Final Clinical Impressions(s) / UC Diagnoses   Final diagnoses:  Viral URI with cough     Discharge Instructions      URI/COLD SYMPTOMS: Your exam today is consistent with a viral illness. Antibiotics are not indicated at this time. Use medications as directed, including cough syrup, nasal saline, and decongestants. Your symptoms should improve over the next few days and resolve within 7-10 days. Increase rest and fluids. F/u if symptoms worsen or predominate such as sore throat, ear pain, productive cough, shortness of breath, or if you develop high fevers or worsening fatigue over the next several days.       ED Prescriptions     Medication Sig Dispense Auth. Provider   promethazine-dextromethorphan (PROMETHAZINE-DM) 6.25-15 MG/5ML syrup Take 5 mLs by mouth at bedtime as needed for cough. 118 mL Eusebio Friendly B, PA-C   benzonatate (TESSALON) 200 MG capsule Take 1 capsule (200 mg total) by mouth 3 (three) times daily as needed for cough. 30 capsule Eusebio Friendly B, PA-C   ipratropium (ATROVENT) 0.06 % nasal spray Place 2 sprays into both nostrils 4 (four) times daily. 15 mL Shirlee Latch, PA-C      PDMP not reviewed this encounter.   Shirlee Latch, PA-C 04/24/23 1343

## 2023-05-24 ENCOUNTER — Ambulatory Visit
Admission: RE | Admit: 2023-05-24 | Discharge: 2023-05-24 | Disposition: A | Discharge status: Home / Self Care | Payer: BLUE CROSS/BLUE SHIELD | Source: Ambulatory Visit | Attending: Physician Assistant | Admitting: Physician Assistant

## 2023-05-24 ENCOUNTER — Other Ambulatory Visit: Payer: Self-pay | Admitting: Physician Assistant

## 2023-05-24 ENCOUNTER — Encounter: Payer: Self-pay | Admitting: Physician Assistant

## 2023-05-24 DIAGNOSIS — R42 Dizziness and giddiness: Secondary | ICD-10-CM

## 2023-05-24 DIAGNOSIS — Z85820 Personal history of malignant melanoma of skin: Secondary | ICD-10-CM

## 2023-05-24 DIAGNOSIS — R519 Headache, unspecified: Secondary | ICD-10-CM

## 2023-05-24 DIAGNOSIS — R634 Abnormal weight loss: Secondary | ICD-10-CM

## 2023-05-24 DIAGNOSIS — R5383 Other fatigue: Secondary | ICD-10-CM | POA: Diagnosis not present

## 2023-06-11 ENCOUNTER — Encounter (INDEPENDENT_AMBULATORY_CARE_PROVIDER_SITE_OTHER): Payer: Medicare Other

## 2023-06-11 ENCOUNTER — Ambulatory Visit (INDEPENDENT_AMBULATORY_CARE_PROVIDER_SITE_OTHER): Payer: Medicare Other | Admitting: Vascular Surgery

## 2023-07-10 ENCOUNTER — Other Ambulatory Visit (INDEPENDENT_AMBULATORY_CARE_PROVIDER_SITE_OTHER): Payer: Self-pay | Admitting: Vascular Surgery

## 2023-07-10 DIAGNOSIS — I6523 Occlusion and stenosis of bilateral carotid arteries: Secondary | ICD-10-CM

## 2023-07-16 ENCOUNTER — Encounter (INDEPENDENT_AMBULATORY_CARE_PROVIDER_SITE_OTHER): Payer: Medicare Other

## 2023-07-16 ENCOUNTER — Ambulatory Visit (INDEPENDENT_AMBULATORY_CARE_PROVIDER_SITE_OTHER): Payer: Medicare Other | Admitting: Vascular Surgery

## 2023-08-13 ENCOUNTER — Encounter (INDEPENDENT_AMBULATORY_CARE_PROVIDER_SITE_OTHER): Payer: Medicare Other

## 2023-08-13 ENCOUNTER — Ambulatory Visit (INDEPENDENT_AMBULATORY_CARE_PROVIDER_SITE_OTHER): Payer: Medicare Other | Admitting: Vascular Surgery

## 2023-09-17 ENCOUNTER — Ambulatory Visit (INDEPENDENT_AMBULATORY_CARE_PROVIDER_SITE_OTHER): Payer: Medicare Other

## 2023-09-17 ENCOUNTER — Ambulatory Visit (INDEPENDENT_AMBULATORY_CARE_PROVIDER_SITE_OTHER): Payer: Medicare Other | Admitting: Vascular Surgery

## 2023-09-17 ENCOUNTER — Encounter (INDEPENDENT_AMBULATORY_CARE_PROVIDER_SITE_OTHER): Payer: Self-pay | Admitting: Vascular Surgery

## 2023-09-17 VITALS — BP 130/72 | HR 74 | Resp 16 | Wt 175.4 lb

## 2023-09-17 DIAGNOSIS — I6522 Occlusion and stenosis of left carotid artery: Secondary | ICD-10-CM | POA: Diagnosis not present

## 2023-09-17 DIAGNOSIS — N183 Chronic kidney disease, stage 3 unspecified: Secondary | ICD-10-CM

## 2023-09-17 DIAGNOSIS — E785 Hyperlipidemia, unspecified: Secondary | ICD-10-CM | POA: Diagnosis not present

## 2023-09-17 DIAGNOSIS — I771 Stricture of artery: Secondary | ICD-10-CM

## 2023-09-17 DIAGNOSIS — I10 Essential (primary) hypertension: Secondary | ICD-10-CM

## 2023-09-17 DIAGNOSIS — I6523 Occlusion and stenosis of bilateral carotid arteries: Secondary | ICD-10-CM | POA: Diagnosis not present

## 2023-09-17 NOTE — Progress Notes (Signed)
MRN : [id number]  Raymond Christian is a 82 y.o. (1941/05/11) male who presents with chief complaint of  Chief Complaint  Patient presents with   Follow-up    31yr carotid ultrasound follow up  .  History of Present Illness: Patient returns in follow-up of his carotid disease.  He is doing well today.  He does have some occasional vertebrobasilar instability but no focal neurologic symptoms. Specifically, the patient denies amaurosis fugax, speech or swallowing difficulties, or arm or leg weakness or numbness.  Carotid duplex today shows stable 1 to 39% right ICA stenosis and a known left carotid artery occlusion.  Current Outpatient Medications  Medication Sig Dispense Refill   amLODipine (NORVASC) 10 MG tablet Take 1 tablet by mouth daily.     aspirin 81 MG EC tablet Take by mouth.     benzonatate (TESSALON) 200 MG capsule Take 1 capsule (200 mg total) by mouth 3 (three) times daily as needed for cough. 30 capsule 0   glipiZIDE (GLUCOTROL XL) 10 MG 24 hr tablet Take 10 mg by mouth daily with breakfast.     losartan (COZAAR) 100 MG tablet Take 100 mg by mouth daily.     omeprazole (PRILOSEC) 20 MG capsule Take 20 mg by mouth every other day.      promethazine-dextromethorphan (PROMETHAZINE-DM) 6.25-15 MG/5ML syrup Take 5 mLs by mouth at bedtime as needed for cough. 118 mL 0   simvastatin (ZOCOR) 20 MG tablet Take 20 mg by mouth at bedtime.      ipratropium (ATROVENT) 0.06 % nasal spray Place 2 sprays into both nostrils 4 (four) times daily. (Patient not taking: Reported on 09/17/2023) 15 mL 0   No current facility-administered medications for this visit.    Past Medical History:  Diagnosis Date   Anxiety 07/09/2014   Essential (primary) hypertension 07/09/2014   Gastro-esophageal reflux disease without esophagitis 07/09/2014   HLD (hyperlipidemia) 07/09/2014   Malignant melanoma (HCC) 07/09/2014   Prostate cancer (HCC) 06/2015   Prostate cancer [facility])     Past Surgical History:   Procedure Laterality Date   CYSTOSCOPY WITH RETROGRADE PYELOGRAM, URETEROSCOPY AND STENT PLACEMENT Right 09/14/2015   Procedure: CYSTOSCOPY WITH RETROGRADE PYELOGRAM, URETEROSCOPY AND STENT PLACEMENT;  Surgeon: Sebastian Ache, MD;  Location: [facility];  Service: Urology;  Laterality: Right;   HERNIA REPAIR     x2   HOLMIUM LASER APPLICATION Right 09/14/2015   Procedure: HOLMIUM LASER APPLICATION;  Surgeon: Sebastian Ache, MD;  Location: [facility];  Service: Urology;  Laterality: Right;   MELANOMA EXCISION       Social History   Tobacco Use   Smoking status: Never   Smokeless tobacco: Never  Vaping Use   Vaping status: Never Used  Substance Use Topics   Alcohol use: No    Alcohol/week: 0.0 standard drinks of alcohol   Drug use: No      Family History  Problem Relation Age of Onset   Diabetes Sister    Kidney disease Sister      No Known Allergies  REVIEW OF SYSTEMS (Negative unless checked)   Constitutional: [] Weight loss  [] Fever  [] Chills Cardiac: [] Chest pain   [] Chest pressure   [] Palpitations   [] Shortness of breath when laying flat   [] Shortness of breath at rest   [] Shortness of breath with exertion. Vascular:  [] Pain in legs with walking   [] Pain in legs at rest   [] Pain in legs when laying flat   [] Claudication   [] Pain in  feet when walking  [] Pain in feet at rest  [] Pain in feet when laying flat   [] History of DVT   [] Phlebitis   [] Swelling in legs   [] Varicose veins   [] Non-healing ulcers Pulmonary:   [] Uses home oxygen   [] Productive cough   [] Hemoptysis   [] Wheeze  [] COPD   [] Asthma Neurologic:  [x] Dizziness  [] Blackouts   [] Seizures   [] History of stroke   [] History of TIA  [] Aphasia   [] Temporary blindness   [] Dysphagia   [] Weakness or numbness in arms   [] Weakness or numbness in legs X positive for headaches Musculoskeletal:  [] Arthritis   [] Joint swelling   [] Joint pain   [] Low back pain Hematologic:  [] Easy bruising  [] Easy bleeding   [] Hypercoagulable state    [] Anemic  [] Hepatitis Gastrointestinal:  [] Blood in stool   [] Vomiting blood  [x] Gastroesophageal reflux/heartburn   [] Abdominal pain Genitourinary:  [] Chronic kidney disease   [] Difficult urination  [x] Frequent urination  [] Burning with urination   [] Hematuria Skin:  [] Rashes   [] Ulcers   [] Wounds Psychological:  [x] History of anxiety   []  History of major depression.  Physical Examination  Vitals:   09/17/23 1537  BP: 130/72  Pulse: 74  Resp: 16  Weight: 175 lb 6.4 oz (79.6 kg)   Body mass index is 26.67 kg/m. Gen:  WD/WN, NAD. Appears younger than stated age. Head: [place]/AT, No temporalis wasting. Ear/Nose/Throat: Hearing grossly intact, nares w/o erythema or drainage, trachea midline Eyes: Conjunctiva clear. Sclera non-icteric Neck: Supple.  Trachea midline Pulmonary:  Good air movement, equal and clear to auscultation bilaterally.  Cardiac: RRR, No JVD Vascular:  Vessel Right Left  Radial Palpable Palpable           Musculoskeletal: M/S 5/5 throughout.  No deformity or atrophy. No edema. Neurologic: CN 2-12 intact. Sensation grossly intact in extremities.  Symmetrical.  Speech is fluent. Motor exam as listed above. Psychiatric: Judgment intact, Mood & affect appropriate for pt's clinical situation. Dermatologic: No rashes or ulcers noted.  No cellulitis or open wounds.     CBC Lab Results  Component Value Date   WBC 6.9 04/15/2021   HGB 13.8 04/15/2021   HCT 39.4 04/15/2021   MCV 91.0 04/15/2021   PLT 301 04/15/2021    BMET    Component Value Date/Time   NA 137 04/15/2021 1919   NA 142 12/25/2013 0508   K 4.2 04/15/2021 1919   K 3.8 12/25/2013 0508   CL 106 04/15/2021 1919   CL 114 (H) 12/25/2013 0508   CO2 23 04/15/2021 1919   CO2 22 12/25/2013 0508   GLUCOSE 217 (H) 04/15/2021 1919   GLUCOSE 90 12/25/2013 0508   BUN 30 (H) 04/15/2021 1919   BUN 13 12/25/2013 0508   CREATININE 1.82 (H) 04/15/2021 1919   CREATININE 1.17 12/25/2013 0508   CALCIUM 9.0  04/15/2021 1919   CALCIUM 7.5 (L) 12/25/2013 0508   GFRNONAA 37 (L) 04/15/2021 1919   GFRNONAA >60 12/25/2013 0508   GFRAA 55 (L) 12/30/2018 0928   GFRAA >60 12/25/2013 0508   CrCl cannot be calculated (Patient's most recent lab result is older than the maximum 21 days allowed.).  COAG No results found for: "INR", "PROTIME"  Radiology No results found.   Assessment/Plan Subclavian artery stenosis, right (HCC) Left arm blood pressure is true blood pressure.  No plan for intervention at this time.  Internal carotid artery occlusion, left Carotid duplex today shows stable 1 to 39% right ICA stenosis and a known  left carotid artery occlusion.  No role for intervention.  Continue aspirin and statin agent.  Recheck in 1 year.  Essential (primary) hypertension blood pressure control important in reducing the progression of atherosclerotic disease. On appropriate oral medications.     CKD (chronic kidney disease) stage 3, GFR 30-59 ml/min (HCC) Avoid contrast unless needed for intervention and hydrate with any contrast load.   Hyperlipidemia, unspecified lipid control important in reducing the progression of atherosclerotic disease. Continue statin therapy  Festus Barren, MD  09/17/2023 4:40 PM    This note was created with Dragon medical transcription system.  Any errors from dictation are purely unintentional

## 2023-09-17 NOTE — Assessment & Plan Note (Signed)
Carotid duplex today shows stable 1 to 39% right ICA stenosis and a known left carotid artery occlusion.  No role for intervention.  Continue aspirin and statin agent.  Recheck in 1 year.

## 2023-09-17 NOTE — Assessment & Plan Note (Signed)
Left arm blood pressure is true blood pressure.  No plan for intervention at this time.

## 2023-10-18 ENCOUNTER — Other Ambulatory Visit: Payer: Self-pay

## 2023-10-18 ENCOUNTER — Emergency Department: Payer: Medicare Other

## 2023-10-18 ENCOUNTER — Encounter: Payer: Self-pay | Admitting: Emergency Medicine

## 2023-10-18 ENCOUNTER — Emergency Department
Admission: EM | Admit: 2023-10-18 | Discharge: 2023-10-18 | Disposition: A | Discharge status: Home / Self Care | Payer: Medicare Other | Attending: Emergency Medicine | Admitting: Emergency Medicine

## 2023-10-18 DIAGNOSIS — I498 Other specified cardiac arrhythmias: Secondary | ICD-10-CM | POA: Insufficient documentation

## 2023-10-18 DIAGNOSIS — I10 Essential (primary) hypertension: Secondary | ICD-10-CM | POA: Diagnosis not present

## 2023-10-18 DIAGNOSIS — R008 Other abnormalities of heart beat: Secondary | ICD-10-CM | POA: Insufficient documentation

## 2023-10-18 DIAGNOSIS — I491 Atrial premature depolarization: Secondary | ICD-10-CM | POA: Diagnosis not present

## 2023-10-18 DIAGNOSIS — I493 Ventricular premature depolarization: Secondary | ICD-10-CM | POA: Diagnosis not present

## 2023-10-18 DIAGNOSIS — Z8546 Personal history of malignant neoplasm of prostate: Secondary | ICD-10-CM | POA: Insufficient documentation

## 2023-10-18 DIAGNOSIS — R001 Bradycardia, unspecified: Secondary | ICD-10-CM | POA: Diagnosis present

## 2023-10-18 LAB — T4, FREE: Free T4: 0.72 ng/dL (ref 0.61–1.12)

## 2023-10-18 LAB — BRAIN NATRIURETIC PEPTIDE: B Natriuretic Peptide: 374.5 pg/mL — ABNORMAL HIGH (ref 0.0–100.0)

## 2023-10-18 LAB — CBC
HCT: 39.4 % (ref 39.0–52.0)
Hemoglobin: 13.7 g/dL (ref 13.0–17.0)
MCH: 32.1 pg (ref 26.0–34.0)
MCHC: 34.8 g/dL (ref 30.0–36.0)
MCV: 92.3 fL (ref 80.0–100.0)
Platelets: 217 10*3/uL (ref 150–400)
RBC: 4.27 MIL/uL (ref 4.22–5.81)
RDW: 13.6 % (ref 11.5–15.5)
WBC: 6.8 10*3/uL (ref 4.0–10.5)
nRBC: 0 % (ref 0.0–0.2)

## 2023-10-18 LAB — BASIC METABOLIC PANEL
Anion gap: 9 (ref 5–15)
BUN: 28 mg/dL — ABNORMAL HIGH (ref 8–23)
CO2: 23 mmol/L (ref 22–32)
Calcium: 9 mg/dL (ref 8.9–10.3)
Chloride: 106 mmol/L (ref 98–111)
Creatinine, Ser: 1.62 mg/dL — ABNORMAL HIGH (ref 0.61–1.24)
GFR, Estimated: 42 mL/min — ABNORMAL LOW (ref >60)
Glucose, Bld: 200 mg/dL — ABNORMAL HIGH (ref 70–99)
Potassium: 4 mmol/L (ref 3.5–5.1)
Sodium: 138 mmol/L (ref 135–145)

## 2023-10-18 LAB — TROPONIN I (HIGH SENSITIVITY)
Troponin I (High Sensitivity): 8 ng/L (ref <18)
Troponin I (High Sensitivity): 6 ng/L (ref <18)

## 2023-10-18 LAB — TSH: TSH: 6.312 u[IU]/mL — ABNORMAL HIGH (ref 0.350–4.500)

## 2023-10-18 LAB — LACTIC ACID, PLASMA: Lactic Acid, Venous: 1.5 mmol/L (ref 0.5–1.9)

## 2023-10-18 MED ORDER — METOPROLOL TARTRATE 25 MG PO TABS
12.5000 mg | ORAL_TABLET | Freq: Once | ORAL | Status: AC
  Administered 2023-10-18: 12.5 mg via ORAL
  Filled 2023-10-18: qty 1

## 2023-10-18 MED ORDER — SODIUM CHLORIDE 0.9 % IV BOLUS
500.0000 mL | Freq: Once | INTRAVENOUS | Status: AC
  Administered 2023-10-18: 500 mL via INTRAVENOUS

## 2023-10-18 MED ORDER — METOPROLOL TARTRATE 25 MG PO TABS: 12.5000 mg | ORAL_TABLET | Freq: Two times a day (BID) | ORAL | 0 refills | Status: AC | PRN

## 2023-10-18 NOTE — Discharge Instructions (Signed)
For your heart rate or symptoms of palpitations/shortness of breath:  - Dr. Okey Dupre, of Cardiology, recommended starting 12.5 mg of metoprolol twice a day  - As we discussed, a good alternative is to take one half tablet (12.5 mg) AS NEEDED for symptoms of palpitations/fast heart rate, or shortness of breath  - If you feel these symptoms, take the medication and wait 15-20 minutes. Measure your blood pressure and heart rate. If you feel better, continue resting. If symptoms do not improve, call EMS or present to the ED.

## 2023-10-18 NOTE — ED Provider Triage Note (Signed)
Emergency Medicine Provider Triage Evaluation Note  Raymond Christian , a 82 y.o. male  was evaluated in triage.  Pt complains of low heart rate, patient states noticed it was between 30 and 40 at home.  Was told to come the ED.  Has history of a carotid artery blockage on the left..  Review of Systems  Positive:  Negative:   Physical Exam  BP (!) 153/61   Pulse (!) 46   Temp 98 F (36.7 C) (Oral)   Resp 20   SpO2 98%  Gen:   Awake, no distress   Resp:  Normal effort  MSK:   Moves extremities without difficulty  Other:    Medical Decision Making  Medically screening exam initiated at 1:00 PM.  Appropriate orders placed.  Raymond Christian was informed that the remainder of the evaluation will be completed by another provider, this initial triage assessment does not replace that evaluation, and the importance of remaining in the ED until their evaluation is complete.  Heart rate on the EKG is 87, patient appears to be stable.  Will get labs   Raymond Ghee, PA-C 10/18/23 1300

## 2023-10-18 NOTE — ED Provider Notes (Signed)
[facility] Provider Note    Event Date/Time   First MD Initiated Contact with Patient 10/18/23 1351     (approximate)   History   Bradycardia   HPI  Raymond Christian is a 82 y.o. male with a history of remote prostate cancer and hypertension  Woke up this morning feeling well, checked his heart rate and blood pressure and found his home blood pressure cuff was reading his heart rate to be low in the 40s.  This prompted him to come for further evaluation.  He reports he has maybe the slightest feeling of a slight flutter or irregular feeling under the breastbone but denies its painful.  He is not short of breath.  He does relate that he has been told in the distant past that he has a slow heart rate, but has never really seen a cardiologist or had that directly addressed.  Denies chest pain.  No shortness of breath no fever no recent illness.  No leg swelling except for typical swelling in his ankles which is a chronic issue.     Physical Exam   Triage Vital Signs: ED Triage Vitals  Encounter Vitals Group     BP 10/18/23 1250 (!) 153/61     Systolic BP Percentile --      Diastolic BP Percentile --      Pulse Rate 10/18/23 1250 (!) 46     Resp 10/18/23 1250 20     Temp 10/18/23 1250 98 F (36.7 C)     Temp Source 10/18/23 1250 Oral     SpO2 10/18/23 1250 98 %     Weight 10/18/23 1337 175 lb 7.8 oz (79.6 kg)     Height 10/18/23 1337 5\' 8"  (1.727 m)     Head Circumference --      Peak Flow --      Pain Score 10/18/23 1252 0     Pain Loc --      Pain Education --      Exclude from Growth Chart --     Most recent vital signs: Vitals:   10/18/23 1505 10/18/23 1507  BP:  100/62  Pulse:  83  Resp:  (!) 22  Temp:  98.3 F (36.8 C)  SpO2: 97% 97%     General: Awake, no distress.  Very pleasant.  Wife at bedside CV:  Good peripheral perfusion.  Normal tones.  Heart rate variable on telemetry noted to have heart rate occasionally with  frequent PACs during which he produces slight bradycardia with heart rate in the 40s, but otherwise normal sinus rhythm.  He is currently with normal sinus rhythm heart rate of 80 at time of my exam but again telemetry reviewed and during the periods of slower pulse oximetry rate is noted that he is having frequent PACs Resp:  Normal effort.  Clear bilateral with normal work of breathing Abd:  No distention.  Soft nontender nondistended Other:  No lower extremity venous cords or congestion   ED Results / Procedures / Treatments   Labs (all labs ordered are listed, but only abnormal results are displayed) Labs Reviewed  BASIC METABOLIC PANEL - Abnormal; Notable for the following components:      Result Value   Glucose, Bld 200 (*)    BUN 28 (*)    Creatinine, Ser 1.62 (*)    GFR, Estimated 42 (*)    All other components within normal limits  BRAIN NATRIURETIC PEPTIDE - Abnormal; Notable for  the following components:   B Natriuretic Peptide 374.5 (*)    All other components within normal limits  TSH - Abnormal; Notable for the following components:   TSH 6.312 (*)    All other components within normal limits  CBC  LACTIC ACID, PLASMA  URINALYSIS, ROUTINE W REFLEX MICROSCOPIC  TROPONIN I (HIGH SENSITIVITY)  TROPONIN I (HIGH SENSITIVITY)   Normal troponin.  Labs demonstrate moderate chronic renal disease without acute abnormality.  CBC normal.  EKG  Sinus rhythm with frequent accompanying PACs in a pattern of bigeminy.  No evidence of acute ischemia   RADIOLOGY  Chest x-ray interpreted by me as negative for acute gross pathology   Final read by radiology pending   PROCEDURES:  Critical Care performed: No  Procedures   MEDICATIONS ORDERED IN ED: Medications  sodium chloride 0.9 % bolus 500 mL (500 mLs Intravenous New Bag/Given 10/18/23 1513)     IMPRESSION / MDM / ASSESSMENT AND PLAN / ED COURSE  I reviewed the triage vital signs and the nursing notes.                               Differential diagnosis includes, but is not limited to, possible arrhythmia, electrolyte abnormality, medication effect, etc.  Unknown as to cause for the PACs but appears he is having occasional bigeminy, during his periods of time and it went up.  At that he may be having slight feeling of palpitation.  Overall very reassuring exam though he is awake alert he has no presyncopal symptoms no chest pain no shortness of breath.  Consulted with our cardiologist Dr. Okey Dupre, who has reviewed ECG and clinical history with me.  Cardiology does advise to consider checking a BNP and TSH  Patient's presentation is most consistent with acute complicated illness / injury requiring diagnostic workup.   The patient is on the cardiac monitor to evaluate for evidence of arrhythmia and/or significant heart rate changes.   Clinical Course as of 10/18/23 1530  Fri Oct 18, 2023  1438 Consulted with, discussed ECG with Dr. Okey Dupre.  He advises reasonable to give a small fluid bolus, and observe briefly.  If blood pressure can tolerate would recommend starting on a low-dose 12.5 mg metoprolol to help reduce PAC burden.  After fluid bolus, requests ED update him on BP/HR and consdier of beta blocker and hold losartan could be advised.  [MQ]    Clinical Course User Index [MQ] Sharyn Creamer, MD   Ongoing care assigned to Dr. Erma Heritage at 3 PM.  Follow-up on BNP, chest x-ray, and reassessment after fluid bolus.  Plan to recontact Dr. Okey Dupre for further cardiac advice after receiving labs and reassessment after fluid bolus  FINAL CLINICAL IMPRESSION(S) / ED DIAGNOSES   Final diagnoses:  Atrial bigeminy     Rx / DC Orders   ED Discharge Orders     None        Note:  This document was prepared using Dragon voice recognition software and may include unintentional dictation errors.   Sharyn Creamer, MD 10/18/23 [phone]

## 2023-10-18 NOTE — ED Provider Notes (Signed)
82 yo M here with pACs, intermittent possible hypotension. On tele, pt has very frequent pACs, but is perfusing well, with baseline BP, no lightheadedness or dizziness, and no other sx. He is ambulatory in ED without any difficulty. His labs are reassuring.  Case d/w Dr. Okey Dupre who recommended trying metop 12.5 PO. Pt did have improvement in pAC burden with this, however he is very reluctant to start a daily medicine. We had a very long discussion about this and have agreed to trial metop PRN when he feels palpitations.   Given his o/w stable appearance, no other sx, no sx of hypoperfusion or angina, will d/c home with Cards f/u.   Shaune Pollack, MD 10/18/23 [phone]

## 2023-10-18 NOTE — ED Notes (Addendum)
See triage note  Presents with a low heart rate  States he noticed at home that his heart rate was low  On arrival to treatment room heart rate 80  Denies any pain

## 2023-10-18 NOTE — ED Notes (Signed)
Gave patient sandwich tray at this time.

## 2023-10-18 NOTE — ED Triage Notes (Signed)
Pt to ED via POV from home. Pt reports was at home and his pulse ox was reading his HR in the 40s. Pt reports HR normally in the 60s. Pt denies blurred vision, dizziness, or SOB. Pt reports intermittent chest tightness.

## 2023-12-31 ENCOUNTER — Ambulatory Visit: Payer: Medicare Other | Admitting: Cardiovascular Disease

## 2024-04-01 ENCOUNTER — Other Ambulatory Visit: Payer: Self-pay | Admitting: Physician Assistant

## 2024-04-01 ENCOUNTER — Ambulatory Visit
Admission: RE | Admit: 2024-04-01 | Discharge: 2024-04-01 | Disposition: A | Discharge status: Home / Self Care | Source: Ambulatory Visit | Attending: Physician Assistant | Admitting: Physician Assistant

## 2024-04-01 DIAGNOSIS — R1012 Left upper quadrant pain: Secondary | ICD-10-CM

## 2024-04-01 DIAGNOSIS — R109 Unspecified abdominal pain: Secondary | ICD-10-CM | POA: Insufficient documentation

## 2024-08-25 ENCOUNTER — Other Ambulatory Visit: Payer: Self-pay | Admitting: Student

## 2024-08-25 DIAGNOSIS — J3801 Paralysis of vocal cords and larynx, unilateral: Secondary | ICD-10-CM

## 2024-08-27 ENCOUNTER — Encounter: Payer: Self-pay | Admitting: Student

## 2024-08-31 ENCOUNTER — Ambulatory Visit
Admission: RE | Admit: 2024-08-31 | Discharge: 2024-08-31 | Disposition: A | Discharge status: Home / Self Care | Source: Ambulatory Visit | Attending: Student | Admitting: Student

## 2024-08-31 DIAGNOSIS — J3801 Paralysis of vocal cords and larynx, unilateral: Secondary | ICD-10-CM

## 2024-08-31 MED ORDER — IOPAMIDOL (ISOVUE-300) INJECTION 61%
75.0000 mL | Freq: Once | INTRAVENOUS | Status: AC | PRN
  Administered 2024-08-31: 75 mL via INTRAVENOUS

## 2024-09-15 ENCOUNTER — Ambulatory Visit (INDEPENDENT_AMBULATORY_CARE_PROVIDER_SITE_OTHER): Payer: Medicare Other | Admitting: Nurse Practitioner

## 2024-09-15 ENCOUNTER — Ambulatory Visit (INDEPENDENT_AMBULATORY_CARE_PROVIDER_SITE_OTHER): Payer: Medicare Other

## 2024-09-15 ENCOUNTER — Encounter (INDEPENDENT_AMBULATORY_CARE_PROVIDER_SITE_OTHER): Payer: Self-pay | Admitting: Nurse Practitioner

## 2024-09-15 VITALS — BP 120/77 | HR 80 | Resp 16 | Wt 174.0 lb

## 2024-09-15 DIAGNOSIS — I10 Essential (primary) hypertension: Secondary | ICD-10-CM | POA: Diagnosis not present

## 2024-09-15 DIAGNOSIS — I6523 Occlusion and stenosis of bilateral carotid arteries: Secondary | ICD-10-CM

## 2024-09-15 DIAGNOSIS — I771 Stricture of artery: Secondary | ICD-10-CM

## 2024-09-15 DIAGNOSIS — I6522 Occlusion and stenosis of left carotid artery: Secondary | ICD-10-CM | POA: Diagnosis not present

## 2024-09-15 DIAGNOSIS — R2689 Other abnormalities of gait and mobility: Secondary | ICD-10-CM

## 2024-09-15 NOTE — Progress Notes (Signed)
 SUBJECTIVE:  Patient ID: Raymond Christian, male    DOB: 1941-03-19, 83 y.o.   MRN: [id number] Chief Complaint  Patient presents with   Follow-up    45yr carotid follow up    Discussed the use of AI scribe software for clinical note transcription with the patient, who gave verbal consent to proceed.  History of Present Illness Raymond Christian is an 83 year old male with carotid artery stenosis who presents for follow-up of his vascular condition. He is accompanied by his sister.  He has a history of carotid artery stenosis with complete occlusion on the left side and 40-59% stenosis on the right side. Over the past six to eight months, he has experienced episodes of drifting to the left while walking. He denies dizziness, vision changes, or lightheadedness. He has had two to three episodes of syncope, which he attributes to coughing triggered by sweet cough drops.  He has a history of melanoma and has undergone significant radiation and chemotherapy. He reports swelling and a sensation of heat on the left side of his head and neck. He has a history of radiation treatment. He also experiences dysphagia and hoarseness after prolonged talking.  He is currently taking simvastatin and aspirin. He denies smoking and reports stable blood sugar and kidney function. He has a history of prostate cancer and has undergone extensive treatment for it.      Results RADIOLOGY Carotid Ultrasound: Left carotid artery 100% occlusion, right carotid artery 40-59% stenosis (09/15/2024)   Past Medical History:  Diagnosis Date   Anxiety 07/09/2014   Essential (primary) hypertension 07/09/2014   Gastro-esophageal reflux disease without esophagitis 07/09/2014   HLD (hyperlipidemia) 07/09/2014   Malignant melanoma (HCC) 07/09/2014   Prostate cancer (HCC) 06/2015   Prostate cancer [facility])     Past Surgical History:  Procedure Laterality Date   CYSTOSCOPY WITH RETROGRADE PYELOGRAM, URETEROSCOPY AND STENT  PLACEMENT Right 09/14/2015   Procedure: CYSTOSCOPY WITH RETROGRADE PYELOGRAM, URETEROSCOPY AND STENT PLACEMENT;  Surgeon: Ricardo Likens, MD;  Location: [facility];  Service: Urology;  Laterality: Right;   HERNIA REPAIR     x2   HOLMIUM LASER APPLICATION Right 09/14/2015   Procedure: HOLMIUM LASER APPLICATION;  Surgeon: Ricardo Likens, MD;  Location: [facility];  Service: Urology;  Laterality: Right;   MELANOMA EXCISION      Social History   Socioeconomic History   Marital status: Married    Spouse name: Not on file   Number of children: Not on file   Years of education: Not on file   Highest education level: Not on file  Occupational History   Not on file  Tobacco Use   Smoking status: Never   Smokeless tobacco: Never  Vaping Use   Vaping status: Never Used  Substance and Sexual Activity   Alcohol use: No    Alcohol/week: 0.0 standard drinks of alcohol   Drug use: No   Sexual activity: Not Currently  Other Topics Concern   Not on file  Social History Narrative   Not on file   Social Drivers of Health   Financial Resource Strain: Low Risk  (11/06/2023)   Received from [facility]   Overall Financial Resource Strain (CARDIA)    Difficulty of Paying Living Expenses: Not hard at all  Food Insecurity: No Food Insecurity (11/06/2023)   Received from [facility]   Hunger Vital Sign    Within the past 12 months, you worried that your food would run out before  you got the money to buy more.: Never true    Within the past 12 months, the food you bought just didn't last and you didn't have money to get more.: Never true  Transportation Needs: No Transportation Needs (11/06/2023)   Received from [facility] - Transportation    In the past 12 months, has lack of transportation kept you from medical appointments or from getting medications?: No    Lack of Transportation (Non-Medical): No  Physical Activity: Not on file  Stress:  Not on file  Social Connections: Not on file  Intimate Partner Violence: Not on file    Family History  Problem Relation Age of Onset   Diabetes Sister    Kidney disease Sister     No Known Allergies   Review of Systems   Review of Systems: Negative Unless Checked Constitutional: [] Weight loss  [] Fever  [] Chills Cardiac: [] Chest pain   []  Atrial Fibrillation  [] Palpitations   [] Shortness of breath when laying flat   [] Shortness of breath with exertion. [] Shortness of breath at rest Vascular:  [] Pain in legs with walking   [] Pain in legs with standing [] Pain in legs when laying flat   [] Claudication    [] Pain in feet when laying flat    [] History of DVT   [] Phlebitis   [] Swelling in legs   [] Varicose veins   [] Non-healing ulcers Pulmonary:   [] Uses home oxygen   [x] Productive cough   [] Hemoptysis   [] Wheeze  [] COPD   [] Asthma Neurologic:  [] Dizziness   [] Seizures  [] Blackouts [] History of stroke   [] History of TIA  [] Aphasia   [] Temporary Blindness   [] Weakness or numbness in arm   [x] Weakness or numbness in leg Musculoskeletal:   [] Joint swelling   [] Joint pain   [] Low back pain  []  History of Knee Replacement [] Arthritis [] back Surgeries  []  Spinal Stenosis    Hematologic:  [] Easy bruising  [] Easy bleeding   [] Hypercoagulable state   [] Anemic Gastrointestinal:  [] Diarrhea   [] Vomiting  [] Gastroesophageal reflux/heartburn   [] Difficulty swallowing. [] Abdominal pain Genitourinary:  [] Chronic kidney disease   [] Difficult urination  [] Anuric   [] Blood in urine [] Frequent urination  [] Burning with urination   [] Hematuria Skin:  [] Rashes   [] Ulcers [] Wounds Psychological:  [] History of anxiety   []  History of major depression  []  Memory Difficulties      OBJECTIVE:     BP 120/77 (BP Location: Left Arm)   Pulse 80   Resp 16   Wt 174 lb (78.9 kg)   BMI 26.46 kg/m   Physical Exam Vitals reviewed.  HENT:     Head: Normocephalic.  Neck:     Vascular: Carotid bruit present.   Cardiovascular:     Rate and Rhythm: Normal rate.     Pulses: Normal pulses.  Pulmonary:     Effort: Pulmonary effort is normal.  Skin:    General: Skin is warm and dry.  Neurological:     Mental Status: He is alert and oriented to person, place, and time.  Psychiatric:        Mood and Affect: Mood normal.        Behavior: Behavior normal.        Thought Content: Thought content normal.        Judgment: Judgment normal.    Physical Exam NECK: Left side of head and neck swollen    CMP     Component Value Date/Time   NA 138 10/18/2023 1254  NA 142 12/25/2013 0508   K 4.0 10/18/2023 1254   K 3.8 12/25/2013 0508   CL 106 10/18/2023 1254   CL 114 (H) 12/25/2013 0508   CO2 23 10/18/2023 1254   CO2 22 12/25/2013 0508   GLUCOSE 200 (H) 10/18/2023 1254   GLUCOSE 90 12/25/2013 0508   BUN 28 (H) 10/18/2023 1254   BUN 13 12/25/2013 0508   CREATININE 1.62 (H) 10/18/2023 1254   CREATININE 1.17 12/25/2013 0508   CALCIUM 9.0 10/18/2023 1254   CALCIUM 7.5 (L) 12/25/2013 0508   PROT 7.8 04/15/2021 1919   PROT 9.0 (H) 12/22/2013 2232   ALBUMIN 4.1 04/15/2021 1919   ALBUMIN 4.7 12/22/2013 2232   AST 28 04/15/2021 1919   AST 38 (H) 12/22/2013 2232   ALT 15 04/15/2021 1919   ALT 45 12/22/2013 2232   ALKPHOS 58 04/15/2021 1919   ALKPHOS 70 12/22/2013 2232   BILITOT 0.7 04/15/2021 1919   BILITOT 0.9 12/22/2013 2232   GFRNONAA 42 (L) 10/18/2023 1254   GFRNONAA >60 12/25/2013 0508    No results found.     ASSESSMENT AND PLAN:  1. 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 Right carotid compensates for cerebral perfusion. - Continue simvastatin and aspirin therapy. - Concerned the patient's balance issues might be related to possible worsening of his either subclavian or carotid stenoses - His syncopal episodes seem to be more vagal related but this is also concerning - As  noted below we will have the patient undergo CT angiogram to evaluate for possible worsening carotid stenosis that may contribute to his symptoms.  2. Essential (primary) hypertension Continue antihypertensive medications as already ordered, these medications have been reviewed and there are no changes at this time.  3. Subclavian artery stenosis, right See plan as noted above  4. Balance disorder Balance disturbance and gait instability Chronic balance disturbance and gait instability possibly related to radiation, surgery, or vascular issues. - Consider CT scan of neck arteries to assess vascular contributions. - Discuss potential ENT or neurology involvement if vascular causes are ruled out. -Discussed with Son, Garen who is agreeable with plan      Current Outpatient Medications on File Prior to Visit  Medication Sig Dispense Refill   amLODipine  (NORVASC ) 10 MG tablet Take 1 tablet by mouth daily.     aspirin 81 MG EC tablet Take by mouth.     benzonatate  (TESSALON ) 200 MG capsule Take 1 capsule (200 mg total) by mouth 3 (three) times daily as needed for cough. 30 capsule 0   glipiZIDE (GLUCOTROL XL) 10 MG 24 hr tablet Take 10 mg by mouth daily with breakfast.     losartan (COZAAR) 100 MG tablet Take 100 mg by mouth daily.     metoprolol  tartrate (LOPRESSOR ) 25 MG tablet Take 0.5 tablets (12.5 mg total) by mouth 2 (two) times daily as needed (palpitations, fast heart rate). 60 tablet 0   omeprazole (PRILOSEC) 20 MG capsule Take 20 mg by mouth every other day.      promethazine -dextromethorphan (PROMETHAZINE -DM) 6.25-15 MG/5ML syrup Take 5 mLs by mouth at bedtime as needed for cough. 118 mL 0   simvastatin (ZOCOR) 20 MG tablet Take 20 mg by mouth at bedtime.      ipratropium (ATROVENT ) 0.06 % nasal spray Place 2 sprays into both nostrils 4 (four) times daily. (Patient not taking: Reported on 09/15/2024) 15 mL 0   No current  facility-administered medications on file prior to visit.     There are no Patient Instructions on file for this visit. No follow-ups on file.   Otis Portal E Dustina Scoggin, NP  This note was completed with Office Manager.  Any errors are purely unintentional.

## 2024-09-21 ENCOUNTER — Telehealth (INDEPENDENT_AMBULATORY_CARE_PROVIDER_SITE_OTHER): Payer: Self-pay | Admitting: Nurse Practitioner

## 2024-09-21 NOTE — Telephone Encounter (Signed)
 error

## 2024-09-22 ENCOUNTER — Ambulatory Visit

## 2024-09-29 ENCOUNTER — Telehealth (INDEPENDENT_AMBULATORY_CARE_PROVIDER_SITE_OTHER): Payer: Self-pay

## 2024-09-29 NOTE — Telephone Encounter (Signed)
 Patients wife would like return call in reference to scheduling scan ordered she may be reached at [phone]

## 2024-10-07 ENCOUNTER — Ambulatory Visit: Admission: RE | Admit: 2024-10-07 | Discharge: 2024-10-07 | Attending: Nurse Practitioner

## 2024-10-07 DIAGNOSIS — I6523 Occlusion and stenosis of bilateral carotid arteries: Secondary | ICD-10-CM | POA: Diagnosis present

## 2024-10-07 LAB — POCT I-STAT CREATININE: Creatinine, Ser: 1.5 mg/dL — ABNORMAL HIGH (ref 0.61–1.24)

## 2024-10-07 MED ORDER — IOHEXOL 350 MG/ML SOLN
75.0000 mL | Freq: Once | INTRAVENOUS | Status: AC | PRN
  Administered 2024-10-07: 75 mL via INTRAVENOUS

## 2024-10-26 ENCOUNTER — Telehealth (INDEPENDENT_AMBULATORY_CARE_PROVIDER_SITE_OTHER): Payer: Self-pay

## 2024-10-26 NOTE — Telephone Encounter (Signed)
 Patient called at this time stating he had CT angio of neck on 10/07/24 and has not received any results or a follow up appointment, spoke to provider at this time, can we get patient scheduled for a follow up and give him a call?

## 2024-10-27 ENCOUNTER — Encounter (INDEPENDENT_AMBULATORY_CARE_PROVIDER_SITE_OTHER): Payer: Self-pay | Admitting: Vascular Surgery

## 2024-10-27 ENCOUNTER — Ambulatory Visit (INDEPENDENT_AMBULATORY_CARE_PROVIDER_SITE_OTHER): Admitting: Vascular Surgery

## 2024-10-27 VITALS — BP 137/77 | HR 82 | Resp 18 | Wt 177.6 lb

## 2024-10-27 DIAGNOSIS — E785 Hyperlipidemia, unspecified: Secondary | ICD-10-CM

## 2024-10-27 DIAGNOSIS — I6522 Occlusion and stenosis of left carotid artery: Secondary | ICD-10-CM | POA: Diagnosis not present

## 2024-10-27 DIAGNOSIS — I10 Essential (primary) hypertension: Secondary | ICD-10-CM

## 2024-10-27 DIAGNOSIS — I771 Stricture of artery: Secondary | ICD-10-CM

## 2024-10-27 NOTE — Progress Notes (Signed)
 "   MRN : [id number]  Raymond Christian is a 83 y.o. (01/21/1941) male who presents with chief complaint of  Chief Complaint  Patient presents with   Follow-up    Ct results follow up   .  History of Present Illness:   Discussed the use of AI scribe software for clinical note transcription with the patient, who gave verbal consent to proceed.  History of Present Illness Raymond Christian is an 83 year old male with chronic left internal carotid artery occlusion and right subclavian artery stenosis who presents for evaluation of neck swelling, voice changes, and gait disturbance.  He describes intermittent, nonpainful swelling and firmness of the left neck with transient enlargement, most recently at 3:00 AM last night, associated with persistent throat swelling. Lying down and resting improve the swelling. He complains of voice issues and left sided hearing issues with this as well.  He reports a change in his voice that he attributes to the left sided neck and throat symptoms. He has not had ENT evaluation.  He notes veering to the left while walking, especially when exiting his car or walking straight, sometimes bumping into walls, without vertigo, spinning, or pain. He also reports numbness over the right face and neck, with decreased sensation to a recent needle stick in that area.  He has right-sided hearing loss and a nonspecific sensation of feeling very unwell during these episodes, without pain. He reports no new or worsening symptoms since his vascular surgery visit one month ago.  He is following up after recent CT angiogram and ultrasound for carotid disease. He denies chest pain, shortness of breath, or acute neurologic compromise.    Results Radiology Neck CT angiogram (09/2024): Left internal carotid artery occlusion, right internal carotid artery with very mild atherosclerotic plaque and less than 20% stenosis, no progression of disease on right, left vertebral artery  enlarged and providing collateral flow, no acute findings.  I have independently reviewed this study.   Current Outpatient Medications  Medication Sig Dispense Refill   amLODipine  (NORVASC ) 10 MG tablet Take 1 tablet by mouth daily.     aspirin 81 MG EC tablet Take by mouth.     benzonatate  (TESSALON ) 200 MG capsule Take 1 capsule (200 mg total) by mouth 3 (three) times daily as needed for cough. 30 capsule 0   glipiZIDE (GLUCOTROL XL) 10 MG 24 hr tablet Take 10 mg by mouth daily with breakfast.     losartan (COZAAR) 100 MG tablet Take 100 mg by mouth daily.     metoprolol  tartrate (LOPRESSOR ) 25 MG tablet Take 0.5 tablets (12.5 mg total) by mouth 2 (two) times daily as needed (palpitations, fast heart rate). 60 tablet 0   omeprazole (PRILOSEC) 20 MG capsule Take 20 mg by mouth every other day.      promethazine -dextromethorphan (PROMETHAZINE -DM) 6.25-15 MG/5ML syrup Take 5 mLs by mouth at bedtime as needed for cough. 118 mL 0   simvastatin (ZOCOR) 20 MG tablet Take 20 mg by mouth at bedtime.      ipratropium (ATROVENT ) 0.06 % nasal spray Place 2 sprays into both nostrils 4 (four) times daily. (Patient not taking: Reported on 10/27/2024) 15 mL 0   No current facility-administered medications for this visit.    Past Medical History:  Diagnosis Date   Anxiety 07/09/2014   Essential (primary) hypertension 07/09/2014   Gastro-esophageal reflux disease without esophagitis 07/09/2014   HLD (hyperlipidemia) 07/09/2014   Malignant melanoma (HCC) 07/09/2014   Prostate cancer (  HCC) 06/2015   Prostate cancer [facility])     Past Surgical History:  Procedure Laterality Date   CYSTOSCOPY WITH RETROGRADE PYELOGRAM, URETEROSCOPY AND STENT PLACEMENT Right 09/14/2015   Procedure: CYSTOSCOPY WITH RETROGRADE PYELOGRAM, URETEROSCOPY AND STENT PLACEMENT;  Surgeon: Ricardo Likens, MD;  Location: [facility];  Service: Urology;  Laterality: Right;   HERNIA REPAIR     x2   HOLMIUM LASER APPLICATION Right 09/14/2015    Procedure: HOLMIUM LASER APPLICATION;  Surgeon: Ricardo Likens, MD;  Location: [facility];  Service: Urology;  Laterality: Right;   MELANOMA EXCISION       Social History[1]    Family History  Problem Relation Age of Onset   Diabetes Sister    Kidney disease Sister      Allergies[2]   REVIEW OF SYSTEMS (Negative unless checked)   Constitutional: [] Weight loss  [] Fever  [] Chills Cardiac: [] Chest pain   [] Chest pressure   [] Palpitations   [] Shortness of breath when laying flat   [] Shortness of breath at rest   [] Shortness of breath with exertion. Vascular:  [] Pain in legs with walking   [] Pain in legs at rest   [] Pain in legs when laying flat   [] Claudication   [] Pain in feet when walking  [] Pain in feet at rest  [] Pain in feet when laying flat   [] History of DVT   [] Phlebitis   [] Swelling in legs   [] Varicose veins   [] Non-healing ulcers Pulmonary:   [] Uses home oxygen   [] Productive cough   [] Hemoptysis   [] Wheeze  [] COPD   [] Asthma Neurologic:  [x] Dizziness  [] Blackouts   [] Seizures   [] History of stroke   [] History of TIA  [] Aphasia   [] Temporary blindness   [] Dysphagia   [] Weakness or numbness in arms   [] Weakness or numbness in legs X positive for headaches Musculoskeletal:  [] Arthritis   [] Joint swelling   [] Joint pain   [] Low back pain Hematologic:  [] Easy bruising  [] Easy bleeding   [] Hypercoagulable state   [] Anemic  [] Hepatitis Gastrointestinal:  [] Blood in stool   [] Vomiting blood  [x] Gastroesophageal reflux/heartburn   [] Abdominal pain Genitourinary:  [] Chronic kidney disease   [] Difficult urination  [x] Frequent urination  [] Burning with urination   [] Hematuria Skin:  [] Rashes   [] Ulcers   [] Wounds Psychological:  [x] History of anxiety   []  History of major depression.  Physical Examination  BP 137/77 (BP Location: Left Arm, Patient Position: Sitting, Cuff Size: Normal)   Pulse 82   Resp 18   Wt 177 lb 9.6 oz (80.6 kg)   BMI 27.00 kg/m  Gen:  WD/WN, NAD Head: [place]/AT,  No temporalis wasting. Ear/Nose/Throat: Hearing grossly intact, nares w/o erythema or drainage Eyes: Conjunctiva clear. Sclera non-icteric Neck: Supple.  Trachea midline Pulmonary:  Good air movement, no use of accessory muscles.  Cardiac: RRR, no JVD Vascular:  Vessel Right Left  Radial Palpable Palpable           Musculoskeletal: M/S 5/5 throughout.  No deformity or atrophy. No edema. Neurologic: Sensation grossly intact in extremities.  Symmetrical.  Speech is fluent.  Psychiatric: Judgment intact, Mood & affect appropriate for pt's clinical situation. Dermatologic: No rashes or ulcers noted.  No cellulitis or open wounds.  Physical Exam     Labs Recent Results (from the past 2160 hours)  I-STAT creatinine     Status: Abnormal   Collection Time: 10/07/24  1:20 PM  Result Value Ref Range   Creatinine, Ser 1.50 (H) 0.61 - 1.24 mg/dL  Radiology CT ANGIO NECK W OR WO CONTRAST Result Date: 10/10/2024 EXAM: CTA Neck 10/07/2024 01:35:49 PM TECHNIQUE: CT of the neck was performed with the administration of intravenous contrast. Multiplanar 2D and/or 3D reformatted images are provided for review. Automated exposure control, iterative reconstruction, and/or weight based adjustment of the mA/kV was utilized to reduce the radiation dose to as low as reasonably achievable. Stenosis of the internal carotid arteries measured using NASCET [facility] Symptomatic Carotid Endarterectomy Trial) criteria. CONTRAST: 75mL of Omnipaque  350. COMPARISON: CT Neck 08/31/2024. CLINICAL HISTORY: Carotid artery stenosis. FINDINGS: AORTIC ARCH AND ARCH VESSELS: Calcific aortic atherosclerosis. No dissection or arterial injury. No significant stenosis of the brachiocephalic or subclavian arteries. CERVICAL CAROTID ARTERIES: Mixed density atherosclerotic disease at the left carotid bifurcation causes occlusion of the left ICA (Internal Carotid Artery) origin, and the left ICA remains occluded throughout the  neck. The right carotid system is unremarkable. No dissection or arterial injury. CERVICAL VERTEBRAL ARTERIES: No dissection, arterial injury, or significant stenosis. LUNGS AND MEDIASTINUM: Unremarkable. SOFT TISSUES: Asymmetric fullness of the right fossa of [place] is likely due to the positioning of the right ICA at this level. BONES: Bilateral mastoid fluid. No acute abnormality. IMPRESSION: 1. Chronic occlusion of the left internal carotid artery at its origin with persistent occlusion throughout the neck, associated with mixed-density atherosclerotic disease at the left carotid bifurcation. Electronically signed by: Franky Stanford MD 10/10/2024 08:06 PM EST RP Workstation: [id number]    Assessment/Plan Assessment & Plan Internal carotid artery occlusion, left Chronic left internal carotid artery occlusion stable for years, asymptomatic with adequate collateral circulation. No new findings on recent CT angiogram.  Right sided carotid stenosis is very mild. - Continued annual carotid artery surveillance with imaging. - Provided reassurance regarding the stability of the occlusion and adequacy of collateral circulation.  Subclavian artery stenosis, right Right subclavian artery stenosis with no progression or symptoms.  None of the vague symptoms he described would be related to this. - Continued annual surveillance of the right subclavian artery with imaging. - Provided reassurance regarding the stability and mild degree of stenosis.  Essential (primary) hypertension blood pressure control important in reducing the progression of atherosclerotic disease. On appropriate oral medications.   Hyperlipidemia, unspecified lipid control important in reducing the progression of atherosclerotic disease. Continue statin therapy    Selinda Gu, MD  10/27/2024 4:12 PM    This note was created with Dragon medical transcription system.  Any errors from dictation are purely unintentional      [1]  Social History Tobacco Use   Smoking status: Never   Smokeless tobacco: Never  Vaping Use   Vaping status: Never Used  Substance Use Topics   Alcohol use: No    Alcohol/week: 0.0 standard drinks of alcohol   Drug use: No  [2] No Known Allergies  "

## 2025-10-26 ENCOUNTER — Ambulatory Visit (INDEPENDENT_AMBULATORY_CARE_PROVIDER_SITE_OTHER): Admitting: Vascular Surgery

## 2025-10-26 ENCOUNTER — Encounter (INDEPENDENT_AMBULATORY_CARE_PROVIDER_SITE_OTHER)
